# Patient Record
Sex: Male | Born: 1940 | Race: White | Hispanic: No | Smoking: Former smoker
Health system: Southern US, Community
[De-identification: ages and names within clinical notes are randomized; demographics above are authoritative.]

## PROBLEM LIST (undated history)

## (undated) DIAGNOSIS — F32A Depression, unspecified: Secondary | ICD-10-CM

## (undated) DIAGNOSIS — R52 Pain, unspecified: Secondary | ICD-10-CM

## (undated) DIAGNOSIS — Z87442 Personal history of urinary calculi: Secondary | ICD-10-CM

## (undated) DIAGNOSIS — R200 Anesthesia of skin: Secondary | ICD-10-CM

## (undated) DIAGNOSIS — M5410 Radiculopathy, site unspecified: Secondary | ICD-10-CM

## (undated) DIAGNOSIS — N400 Enlarged prostate without lower urinary tract symptoms: Secondary | ICD-10-CM

## (undated) DIAGNOSIS — M1990 Unspecified osteoarthritis, unspecified site: Secondary | ICD-10-CM

## (undated) DIAGNOSIS — M79662 Pain in left lower leg: Secondary | ICD-10-CM

## (undated) DIAGNOSIS — M549 Dorsalgia, unspecified: Secondary | ICD-10-CM

## (undated) DIAGNOSIS — I10 Essential (primary) hypertension: Secondary | ICD-10-CM

## (undated) DIAGNOSIS — K219 Gastro-esophageal reflux disease without esophagitis: Secondary | ICD-10-CM

## (undated) HISTORY — DX: Pain in left lower leg: M79.662

## (undated) HISTORY — PX: APPENDECTOMY: SHX54

## (undated) HISTORY — PX: TONSILLECTOMY: SUR1361

## (undated) HISTORY — DX: Radiculopathy, site unspecified: M54.10

## (undated) HISTORY — DX: Anesthesia of skin: R20.0

## (undated) HISTORY — DX: Dorsalgia, unspecified: M54.9

---

## 1976-05-27 HISTORY — PX: NECK SURGERY: SHX720

## 2000-10-24 ENCOUNTER — Emergency Department (HOSPITAL_COMMUNITY): Admission: EM | Admit: 2000-10-24 | Discharge: 2000-10-24 | Payer: Self-pay | Admitting: *Deleted

## 2000-10-30 ENCOUNTER — Ambulatory Visit (HOSPITAL_COMMUNITY): Admission: RE | Admit: 2000-10-30 | Discharge: 2000-10-30 | Payer: Self-pay | Admitting: Urology

## 2000-10-30 ENCOUNTER — Encounter: Payer: Self-pay | Admitting: Urology

## 2001-03-13 ENCOUNTER — Ambulatory Visit (HOSPITAL_COMMUNITY): Admission: RE | Admit: 2001-03-13 | Discharge: 2001-03-13 | Payer: Self-pay | Admitting: Gastroenterology

## 2001-12-23 ENCOUNTER — Encounter: Payer: Self-pay | Admitting: Urology

## 2001-12-23 ENCOUNTER — Encounter: Admission: RE | Admit: 2001-12-23 | Discharge: 2001-12-23 | Payer: Self-pay | Admitting: Urology

## 2009-07-20 DIAGNOSIS — N200 Calculus of kidney: Secondary | ICD-10-CM | POA: Insufficient documentation

## 2009-07-20 DIAGNOSIS — I10 Essential (primary) hypertension: Secondary | ICD-10-CM | POA: Insufficient documentation

## 2009-07-20 DIAGNOSIS — M1990 Unspecified osteoarthritis, unspecified site: Secondary | ICD-10-CM | POA: Insufficient documentation

## 2009-07-20 DIAGNOSIS — K219 Gastro-esophageal reflux disease without esophagitis: Secondary | ICD-10-CM | POA: Insufficient documentation

## 2011-05-28 HISTORY — PX: COLONOSCOPY W/ BIOPSIES AND POLYPECTOMY: SHX1376

## 2011-06-25 DIAGNOSIS — I10 Essential (primary) hypertension: Secondary | ICD-10-CM | POA: Diagnosis not present

## 2011-06-25 DIAGNOSIS — Z125 Encounter for screening for malignant neoplasm of prostate: Secondary | ICD-10-CM | POA: Diagnosis not present

## 2011-07-01 DIAGNOSIS — Z1212 Encounter for screening for malignant neoplasm of rectum: Secondary | ICD-10-CM | POA: Diagnosis not present

## 2011-07-02 DIAGNOSIS — N401 Enlarged prostate with lower urinary tract symptoms: Secondary | ICD-10-CM | POA: Diagnosis not present

## 2011-07-02 DIAGNOSIS — K219 Gastro-esophageal reflux disease without esophagitis: Secondary | ICD-10-CM | POA: Diagnosis not present

## 2011-07-02 DIAGNOSIS — L819 Disorder of pigmentation, unspecified: Secondary | ICD-10-CM | POA: Diagnosis not present

## 2011-07-02 DIAGNOSIS — Z0000 Encounter for general adult medical examination without abnormal findings: Secondary | ICD-10-CM | POA: Diagnosis not present

## 2011-07-02 DIAGNOSIS — D485 Neoplasm of uncertain behavior of skin: Secondary | ICD-10-CM | POA: Diagnosis not present

## 2011-07-02 DIAGNOSIS — D239 Other benign neoplasm of skin, unspecified: Secondary | ICD-10-CM | POA: Diagnosis not present

## 2011-07-02 DIAGNOSIS — L821 Other seborrheic keratosis: Secondary | ICD-10-CM | POA: Diagnosis not present

## 2011-07-02 DIAGNOSIS — I10 Essential (primary) hypertension: Secondary | ICD-10-CM | POA: Diagnosis not present

## 2011-07-03 DIAGNOSIS — N401 Enlarged prostate with lower urinary tract symptoms: Secondary | ICD-10-CM | POA: Diagnosis not present

## 2011-10-26 DIAGNOSIS — S60459A Superficial foreign body of unspecified finger, initial encounter: Secondary | ICD-10-CM | POA: Diagnosis not present

## 2011-10-26 DIAGNOSIS — Z23 Encounter for immunization: Secondary | ICD-10-CM | POA: Diagnosis not present

## 2012-02-11 DIAGNOSIS — Z23 Encounter for immunization: Secondary | ICD-10-CM | POA: Diagnosis not present

## 2012-03-16 DIAGNOSIS — H52209 Unspecified astigmatism, unspecified eye: Secondary | ICD-10-CM | POA: Diagnosis not present

## 2012-03-16 DIAGNOSIS — Z961 Presence of intraocular lens: Secondary | ICD-10-CM | POA: Diagnosis not present

## 2012-04-07 DIAGNOSIS — I7090 Unspecified atherosclerosis: Secondary | ICD-10-CM | POA: Diagnosis not present

## 2012-04-07 DIAGNOSIS — M1990 Unspecified osteoarthritis, unspecified site: Secondary | ICD-10-CM | POA: Diagnosis not present

## 2012-04-07 DIAGNOSIS — M47817 Spondylosis without myelopathy or radiculopathy, lumbosacral region: Secondary | ICD-10-CM | POA: Diagnosis not present

## 2012-04-07 DIAGNOSIS — M5137 Other intervertebral disc degeneration, lumbosacral region: Secondary | ICD-10-CM | POA: Diagnosis not present

## 2012-04-07 DIAGNOSIS — IMO0002 Reserved for concepts with insufficient information to code with codable children: Secondary | ICD-10-CM | POA: Diagnosis not present

## 2012-04-07 DIAGNOSIS — R030 Elevated blood-pressure reading, without diagnosis of hypertension: Secondary | ICD-10-CM | POA: Diagnosis not present

## 2012-04-09 DIAGNOSIS — M47817 Spondylosis without myelopathy or radiculopathy, lumbosacral region: Secondary | ICD-10-CM | POA: Insufficient documentation

## 2012-05-15 DIAGNOSIS — M545 Low back pain, unspecified: Secondary | ICD-10-CM | POA: Diagnosis not present

## 2012-05-15 DIAGNOSIS — M546 Pain in thoracic spine: Secondary | ICD-10-CM | POA: Diagnosis not present

## 2012-05-15 DIAGNOSIS — M48061 Spinal stenosis, lumbar region without neurogenic claudication: Secondary | ICD-10-CM | POA: Diagnosis not present

## 2012-05-15 DIAGNOSIS — M5137 Other intervertebral disc degeneration, lumbosacral region: Secondary | ICD-10-CM | POA: Diagnosis not present

## 2012-05-15 DIAGNOSIS — IMO0002 Reserved for concepts with insufficient information to code with codable children: Secondary | ICD-10-CM | POA: Diagnosis not present

## 2012-05-15 DIAGNOSIS — M47817 Spondylosis without myelopathy or radiculopathy, lumbosacral region: Secondary | ICD-10-CM | POA: Diagnosis not present

## 2012-06-26 DIAGNOSIS — I10 Essential (primary) hypertension: Secondary | ICD-10-CM | POA: Diagnosis not present

## 2012-06-26 DIAGNOSIS — Z125 Encounter for screening for malignant neoplasm of prostate: Secondary | ICD-10-CM | POA: Diagnosis not present

## 2012-06-30 DIAGNOSIS — M5137 Other intervertebral disc degeneration, lumbosacral region: Secondary | ICD-10-CM | POA: Diagnosis not present

## 2012-07-03 DIAGNOSIS — IMO0002 Reserved for concepts with insufficient information to code with codable children: Secondary | ICD-10-CM | POA: Diagnosis not present

## 2012-07-03 DIAGNOSIS — Z1331 Encounter for screening for depression: Secondary | ICD-10-CM | POA: Diagnosis not present

## 2012-07-03 DIAGNOSIS — M47817 Spondylosis without myelopathy or radiculopathy, lumbosacral region: Secondary | ICD-10-CM | POA: Diagnosis not present

## 2012-07-03 DIAGNOSIS — Z0000 Encounter for general adult medical examination without abnormal findings: Secondary | ICD-10-CM | POA: Diagnosis not present

## 2012-07-08 DIAGNOSIS — R351 Nocturia: Secondary | ICD-10-CM | POA: Diagnosis not present

## 2012-07-08 DIAGNOSIS — N401 Enlarged prostate with lower urinary tract symptoms: Secondary | ICD-10-CM | POA: Diagnosis not present

## 2012-10-06 DIAGNOSIS — M545 Low back pain, unspecified: Secondary | ICD-10-CM | POA: Diagnosis not present

## 2012-10-06 DIAGNOSIS — IMO0002 Reserved for concepts with insufficient information to code with codable children: Secondary | ICD-10-CM | POA: Diagnosis not present

## 2012-10-06 DIAGNOSIS — M5137 Other intervertebral disc degeneration, lumbosacral region: Secondary | ICD-10-CM | POA: Diagnosis not present

## 2012-10-06 DIAGNOSIS — M47817 Spondylosis without myelopathy or radiculopathy, lumbosacral region: Secondary | ICD-10-CM | POA: Diagnosis not present

## 2012-10-15 DIAGNOSIS — M47817 Spondylosis without myelopathy or radiculopathy, lumbosacral region: Secondary | ICD-10-CM | POA: Diagnosis not present

## 2012-11-10 DIAGNOSIS — M5137 Other intervertebral disc degeneration, lumbosacral region: Secondary | ICD-10-CM | POA: Diagnosis not present

## 2012-11-10 DIAGNOSIS — IMO0002 Reserved for concepts with insufficient information to code with codable children: Secondary | ICD-10-CM | POA: Diagnosis not present

## 2012-11-10 DIAGNOSIS — M47817 Spondylosis without myelopathy or radiculopathy, lumbosacral region: Secondary | ICD-10-CM | POA: Diagnosis not present

## 2012-11-10 DIAGNOSIS — M48061 Spinal stenosis, lumbar region without neurogenic claudication: Secondary | ICD-10-CM | POA: Diagnosis not present

## 2012-12-10 DIAGNOSIS — M48061 Spinal stenosis, lumbar region without neurogenic claudication: Secondary | ICD-10-CM | POA: Diagnosis not present

## 2012-12-10 DIAGNOSIS — M5137 Other intervertebral disc degeneration, lumbosacral region: Secondary | ICD-10-CM | POA: Diagnosis not present

## 2012-12-10 DIAGNOSIS — IMO0002 Reserved for concepts with insufficient information to code with codable children: Secondary | ICD-10-CM | POA: Diagnosis not present

## 2012-12-10 DIAGNOSIS — M47817 Spondylosis without myelopathy or radiculopathy, lumbosacral region: Secondary | ICD-10-CM | POA: Diagnosis not present

## 2013-01-08 DIAGNOSIS — M48061 Spinal stenosis, lumbar region without neurogenic claudication: Secondary | ICD-10-CM | POA: Diagnosis not present

## 2013-01-08 DIAGNOSIS — M545 Low back pain, unspecified: Secondary | ICD-10-CM | POA: Diagnosis not present

## 2013-01-08 DIAGNOSIS — M47817 Spondylosis without myelopathy or radiculopathy, lumbosacral region: Secondary | ICD-10-CM | POA: Diagnosis not present

## 2013-01-08 DIAGNOSIS — IMO0002 Reserved for concepts with insufficient information to code with codable children: Secondary | ICD-10-CM | POA: Diagnosis not present

## 2013-02-03 DIAGNOSIS — N200 Calculus of kidney: Secondary | ICD-10-CM | POA: Diagnosis not present

## 2013-02-03 DIAGNOSIS — N390 Urinary tract infection, site not specified: Secondary | ICD-10-CM | POA: Diagnosis not present

## 2013-02-03 DIAGNOSIS — N201 Calculus of ureter: Secondary | ICD-10-CM | POA: Diagnosis not present

## 2013-02-11 DIAGNOSIS — Z23 Encounter for immunization: Secondary | ICD-10-CM | POA: Diagnosis not present

## 2013-03-09 ENCOUNTER — Other Ambulatory Visit: Payer: Self-pay | Admitting: Neurosurgery

## 2013-03-09 DIAGNOSIS — IMO0002 Reserved for concepts with insufficient information to code with codable children: Secondary | ICD-10-CM | POA: Diagnosis not present

## 2013-03-09 DIAGNOSIS — M47817 Spondylosis without myelopathy or radiculopathy, lumbosacral region: Secondary | ICD-10-CM | POA: Diagnosis not present

## 2013-03-09 DIAGNOSIS — M545 Low back pain, unspecified: Secondary | ICD-10-CM | POA: Diagnosis not present

## 2013-03-09 DIAGNOSIS — M48061 Spinal stenosis, lumbar region without neurogenic claudication: Secondary | ICD-10-CM | POA: Diagnosis not present

## 2013-03-10 DIAGNOSIS — D1801 Hemangioma of skin and subcutaneous tissue: Secondary | ICD-10-CM | POA: Diagnosis not present

## 2013-03-10 DIAGNOSIS — D239 Other benign neoplasm of skin, unspecified: Secondary | ICD-10-CM | POA: Diagnosis not present

## 2013-03-10 DIAGNOSIS — L821 Other seborrheic keratosis: Secondary | ICD-10-CM | POA: Diagnosis not present

## 2013-03-16 ENCOUNTER — Encounter (HOSPITAL_COMMUNITY): Payer: Self-pay | Admitting: Pharmacy Technician

## 2013-03-18 ENCOUNTER — Encounter (HOSPITAL_COMMUNITY): Payer: Self-pay

## 2013-03-18 ENCOUNTER — Encounter (HOSPITAL_COMMUNITY)
Admission: RE | Admit: 2013-03-18 | Discharge: 2013-03-18 | Disposition: A | Discharge status: Home / Self Care | Payer: Medicare Other | Source: Ambulatory Visit | Attending: Neurosurgery | Admitting: Neurosurgery

## 2013-03-18 ENCOUNTER — Other Ambulatory Visit (HOSPITAL_COMMUNITY): Payer: Self-pay | Admitting: *Deleted

## 2013-03-18 DIAGNOSIS — I10 Essential (primary) hypertension: Secondary | ICD-10-CM | POA: Diagnosis not present

## 2013-03-18 DIAGNOSIS — Z01810 Encounter for preprocedural cardiovascular examination: Secondary | ICD-10-CM | POA: Insufficient documentation

## 2013-03-18 DIAGNOSIS — Z01818 Encounter for other preprocedural examination: Secondary | ICD-10-CM | POA: Diagnosis not present

## 2013-03-18 DIAGNOSIS — Z01812 Encounter for preprocedural laboratory examination: Secondary | ICD-10-CM | POA: Insufficient documentation

## 2013-03-18 HISTORY — DX: Gastro-esophageal reflux disease without esophagitis: K21.9

## 2013-03-18 HISTORY — DX: Essential (primary) hypertension: I10

## 2013-03-18 LAB — BASIC METABOLIC PANEL WITH GFR
BUN: 19 mg/dL (ref 6–23)
CO2: 25 meq/L (ref 19–32)
Calcium: 9.1 mg/dL (ref 8.4–10.5)
Chloride: 101 meq/L (ref 96–112)
Creatinine, Ser: 0.91 mg/dL (ref 0.50–1.35)
GFR calc Af Amer: >90 mL/min
GFR calc non Af Amer: 84 mL/min — ABNORMAL LOW
Glucose, Bld: 100 mg/dL — ABNORMAL HIGH (ref 70–99)
Potassium: 3.8 meq/L (ref 3.5–5.1)
Sodium: 137 meq/L (ref 135–145)

## 2013-03-18 LAB — CBC
HCT: 43.4 % (ref 39.0–52.0)
Hemoglobin: 15.1 g/dL (ref 13.0–17.0)
MCH: 32.9 pg (ref 26.0–34.0)
MCHC: 34.8 g/dL (ref 30.0–36.0)
MCV: 94.6 fL (ref 78.0–100.0)
Platelets: 233 10*3/uL (ref 150–400)
RBC: 4.59 MIL/uL (ref 4.22–5.81)
RDW: 12.6 % (ref 11.5–15.5)
WBC: 5.7 10*3/uL (ref 4.0–10.5)

## 2013-03-18 LAB — SURGICAL PCR SCREEN
MRSA, PCR: NEGATIVE
Staphylococcus aureus: NEGATIVE

## 2013-03-18 NOTE — Pre-Procedure Instructions (Signed)
Brent Hanna  03/18/2013   Your procedure is scheduled on:  Wednesday, March 24, 2013 at 9:00 AM.   Report to [facility] Entrance "A" at 7:00 AM.   Call this number if you have problems the morning of surgery: [phone]   Remember:   Do not eat food or drink liquids after midnight Tuesday, 03/23/13.   Take these medicines the morning of surgery with A SIP OF WATER: silodosin (RAPAFLO)   Stop all Vitamins, Herbal Medications, Aspirin, and NSAIDS (Naproxen, Ibuprofen) as of today, 03/18/13    Do not wear jewelry.  Do not wear lotions, powders, or cologne. You may wear deodorant.  Do not shave 48 hours prior to surgery. Men may shave face and neck.  Do not bring valuables to the hospital.  [facility] is not responsible                  for any belongings or valuables.               Contacts, dentures or bridgework may not be worn into surgery.  Leave suitcase in the car. After surgery it may be brought to your room.  For patients admitted to the hospital, discharge time is determined by your                treatment team.              Special Instructions: Shower using CHG 2 nights before surgery and the night before surgery.  If you shower the day of surgery use CHG.  Use special wash - you have one bottle of CHG for all showers.  You should use approximately 1/3 of the bottle for each shower.   Please read over the following fact sheets that you were given: Pain Booklet, Coughing and Deep Breathing, MRSA Information and Surgical Site Infection Prevention

## 2013-03-23 MED ORDER — CEFAZOLIN SODIUM-DEXTROSE 2-3 GM-% IV SOLR
2.0000 g | INTRAVENOUS | Status: AC
  Administered 2013-03-24: 2 g via INTRAVENOUS
  Filled 2013-03-23: qty 50

## 2013-03-24 ENCOUNTER — Encounter (HOSPITAL_COMMUNITY): Payer: Self-pay | Admitting: Surgery

## 2013-03-24 ENCOUNTER — Inpatient Hospital Stay (HOSPITAL_COMMUNITY): Payer: Medicare Other

## 2013-03-24 ENCOUNTER — Encounter (HOSPITAL_COMMUNITY): Payer: Medicare Other | Admitting: Certified Registered Nurse Anesthetist

## 2013-03-24 ENCOUNTER — Inpatient Hospital Stay (HOSPITAL_COMMUNITY): Payer: Medicare Other | Admitting: Certified Registered Nurse Anesthetist

## 2013-03-24 ENCOUNTER — Inpatient Hospital Stay (HOSPITAL_COMMUNITY)
Admission: RE | Admit: 2013-03-24 | Discharge: 2013-03-25 | DRG: 460 | Disposition: A | Discharge status: Home / Self Care | Payer: Medicare Other | Source: Ambulatory Visit | Attending: Neurosurgery | Admitting: Neurosurgery

## 2013-03-24 ENCOUNTER — Encounter (HOSPITAL_COMMUNITY)
Admission: RE | Disposition: A | Discharge status: Home / Self Care | Payer: Medicare Other | Source: Ambulatory Visit | Attending: Neurosurgery

## 2013-03-24 DIAGNOSIS — K219 Gastro-esophageal reflux disease without esophagitis: Secondary | ICD-10-CM | POA: Diagnosis present

## 2013-03-24 DIAGNOSIS — I129 Hypertensive chronic kidney disease with stage 1 through stage 4 chronic kidney disease, or unspecified chronic kidney disease: Secondary | ICD-10-CM | POA: Diagnosis not present

## 2013-03-24 DIAGNOSIS — M5137 Other intervertebral disc degeneration, lumbosacral region: Secondary | ICD-10-CM | POA: Diagnosis present

## 2013-03-24 DIAGNOSIS — M51379 Other intervertebral disc degeneration, lumbosacral region without mention of lumbar back pain or lower extremity pain: Secondary | ICD-10-CM | POA: Diagnosis present

## 2013-03-24 DIAGNOSIS — M47817 Spondylosis without myelopathy or radiculopathy, lumbosacral region: Principal | ICD-10-CM | POA: Diagnosis present

## 2013-03-24 DIAGNOSIS — M48061 Spinal stenosis, lumbar region without neurogenic claudication: Secondary | ICD-10-CM | POA: Diagnosis not present

## 2013-03-24 DIAGNOSIS — N189 Chronic kidney disease, unspecified: Secondary | ICD-10-CM | POA: Diagnosis present

## 2013-03-24 DIAGNOSIS — M519 Unspecified thoracic, thoracolumbar and lumbosacral intervertebral disc disorder: Secondary | ICD-10-CM | POA: Diagnosis not present

## 2013-03-24 DIAGNOSIS — IMO0002 Reserved for concepts with insufficient information to code with codable children: Secondary | ICD-10-CM | POA: Diagnosis not present

## 2013-03-24 HISTORY — PX: LAMINECTOMY WITH POSTERIOR LATERAL ARTHRODESIS LEVEL 1: SHX6335

## 2013-03-24 SURGERY — LAMINECTOMY WITH POSTERIOR LATERAL ARTHRODESIS LEVEL 1
Anesthesia: General | Site: Spine Lumbar | Laterality: Left | Wound class: Clean

## 2013-03-24 MED ORDER — LOSARTAN POTASSIUM 25 MG PO TABS
12.5000 mg | ORAL_TABLET | ORAL | Status: DC
  Administered 2013-03-24: 12.5 mg via ORAL
  Filled 2013-03-24: qty 0.5

## 2013-03-24 MED ORDER — HYDROMORPHONE HCL PF 1 MG/ML IJ SOLN
INTRAMUSCULAR | Status: AC
  Filled 2013-03-24: qty 1

## 2013-03-24 MED ORDER — ARTIFICIAL TEARS OP OINT
TOPICAL_OINTMENT | OPHTHALMIC | Status: DC | PRN
  Administered 2013-03-24: 1 via OPHTHALMIC

## 2013-03-24 MED ORDER — GLYCOPYRROLATE 0.2 MG/ML IJ SOLN
INTRAMUSCULAR | Status: DC | PRN
  Administered 2013-03-24: .8 mg via INTRAVENOUS

## 2013-03-24 MED ORDER — KCL IN DEXTROSE-NACL 20-5-0.45 MEQ/L-%-% IV SOLN
INTRAVENOUS | Status: DC
  Filled 2013-03-24 (×5): qty 1000

## 2013-03-24 MED ORDER — ROCURONIUM BROMIDE 100 MG/10ML IV SOLN
INTRAVENOUS | Status: DC | PRN
  Administered 2013-03-24: 50 mg via INTRAVENOUS

## 2013-03-24 MED ORDER — MIDAZOLAM HCL 5 MG/5ML IJ SOLN
INTRAMUSCULAR | Status: DC | PRN
  Administered 2013-03-24 (×2): 1 mg via INTRAVENOUS

## 2013-03-24 MED ORDER — KETOROLAC TROMETHAMINE 30 MG/ML IJ SOLN
30.0000 mg | Freq: Once | INTRAMUSCULAR | Status: AC
  Administered 2013-03-24: 30 mg via INTRAVENOUS

## 2013-03-24 MED ORDER — ACETAMINOPHEN 650 MG RE SUPP: 650.0000 mg | RECTAL | Status: DC | PRN

## 2013-03-24 MED ORDER — OXYCODONE-ACETAMINOPHEN 5-325 MG PO TABS
ORAL_TABLET | ORAL | Status: AC
  Filled 2013-03-24: qty 2

## 2013-03-24 MED ORDER — HYDROXYZINE HCL 25 MG PO TABS: 50.0000 mg | ORAL_TABLET | ORAL | Status: DC | PRN

## 2013-03-24 MED ORDER — ALBUMIN HUMAN 5 % IV SOLN
INTRAVENOUS | Status: DC | PRN
  Administered 2013-03-24: 11:00:00 via INTRAVENOUS

## 2013-03-24 MED ORDER — KETOROLAC TROMETHAMINE 30 MG/ML IJ SOLN
INTRAMUSCULAR | Status: AC
  Filled 2013-03-24: qty 1

## 2013-03-24 MED ORDER — PHENYLEPHRINE HCL 10 MG/ML IJ SOLN
INTRAMUSCULAR | Status: DC | PRN
  Administered 2013-03-24: 80 ug via INTRAVENOUS
  Administered 2013-03-24 (×2): 40 ug via INTRAVENOUS

## 2013-03-24 MED ORDER — ZOLPIDEM TARTRATE 5 MG PO TABS: 5.0000 mg | ORAL_TABLET | Freq: Every evening | ORAL | Status: DC | PRN

## 2013-03-24 MED ORDER — ONDANSETRON HCL 4 MG/2ML IJ SOLN: 4.0000 mg | Freq: Four times a day (QID) | INTRAMUSCULAR | Status: DC | PRN

## 2013-03-24 MED ORDER — FENTANYL CITRATE 0.05 MG/ML IJ SOLN
INTRAMUSCULAR | Status: DC | PRN
  Administered 2013-03-24 (×2): 50 ug via INTRAVENOUS
  Administered 2013-03-24: 100 ug via INTRAVENOUS

## 2013-03-24 MED ORDER — PROPOFOL 10 MG/ML IV BOLUS
INTRAVENOUS | Status: DC | PRN
  Administered 2013-03-24: 60 mg via INTRAVENOUS
  Administered 2013-03-24: 140 mg via INTRAVENOUS

## 2013-03-24 MED ORDER — VECURONIUM BROMIDE 10 MG IV SOLR
INTRAVENOUS | Status: DC | PRN
  Administered 2013-03-24 (×2): 1 mg via INTRAVENOUS
  Administered 2013-03-24: 2 mg via INTRAVENOUS
  Administered 2013-03-24: 1 mg via INTRAVENOUS
  Administered 2013-03-24: 2 mg via INTRAVENOUS

## 2013-03-24 MED ORDER — CYCLOBENZAPRINE HCL 10 MG PO TABS
10.0000 mg | ORAL_TABLET | Freq: Three times a day (TID) | ORAL | Status: DC | PRN
  Administered 2013-03-24 (×2): 10 mg via ORAL
  Filled 2013-03-24: qty 1

## 2013-03-24 MED ORDER — NEOSTIGMINE METHYLSULFATE 1 MG/ML IJ SOLN
INTRAMUSCULAR | Status: DC | PRN
  Administered 2013-03-24: 5 mg via INTRAVENOUS

## 2013-03-24 MED ORDER — KETOROLAC TROMETHAMINE 30 MG/ML IJ SOLN
30.0000 mg | Freq: Four times a day (QID) | INTRAMUSCULAR | Status: DC
  Administered 2013-03-24 – 2013-03-25 (×4): 30 mg via INTRAVENOUS
  Filled 2013-03-24 (×7): qty 1

## 2013-03-24 MED ORDER — ONDANSETRON HCL 4 MG/2ML IJ SOLN
INTRAMUSCULAR | Status: DC | PRN
  Administered 2013-03-24: 4 mg via INTRAVENOUS

## 2013-03-24 MED ORDER — CYCLOBENZAPRINE HCL 10 MG PO TABS
ORAL_TABLET | ORAL | Status: AC
  Filled 2013-03-24: qty 1

## 2013-03-24 MED ORDER — MORPHINE SULFATE 4 MG/ML IJ SOLN: 4.0000 mg | INTRAMUSCULAR | Status: DC | PRN

## 2013-03-24 MED ORDER — ALUM & MAG HYDROXIDE-SIMETH 200-200-20 MG/5ML PO SUSP: 30.0000 mL | Freq: Four times a day (QID) | ORAL | Status: DC | PRN

## 2013-03-24 MED ORDER — SODIUM CHLORIDE 0.9 % IJ SOLN: 3.0000 mL | INTRAMUSCULAR | Status: DC | PRN

## 2013-03-24 MED ORDER — TAMSULOSIN HCL 0.4 MG PO CAPS
0.4000 mg | ORAL_CAPSULE | Freq: Every day | ORAL | Status: DC
  Administered 2013-03-25: 0.4 mg via ORAL
  Filled 2013-03-24 (×3): qty 1

## 2013-03-24 MED ORDER — LIDOCAINE HCL (CARDIAC) 20 MG/ML IV SOLN
INTRAVENOUS | Status: DC | PRN
  Administered 2013-03-24: 50 mg via INTRAVENOUS

## 2013-03-24 MED ORDER — DEXAMETHASONE SODIUM PHOSPHATE 4 MG/ML IJ SOLN
INTRAMUSCULAR | Status: DC | PRN
  Administered 2013-03-24: 8 mg via INTRAVENOUS

## 2013-03-24 MED ORDER — THROMBIN 20000 UNITS EX SOLR
CUTANEOUS | Status: DC | PRN
  Administered 2013-03-24: 10:00:00 via TOPICAL

## 2013-03-24 MED ORDER — HYDROMORPHONE HCL PF 1 MG/ML IJ SOLN
0.2500 mg | INTRAMUSCULAR | Status: DC | PRN
  Administered 2013-03-24 (×4): 0.5 mg via INTRAVENOUS

## 2013-03-24 MED ORDER — SODIUM CHLORIDE 0.9 % IR SOLN
Status: DC | PRN
  Administered 2013-03-24: 10:00:00

## 2013-03-24 MED ORDER — 0.9 % SODIUM CHLORIDE (POUR BTL) OPTIME
TOPICAL | Status: DC | PRN
  Administered 2013-03-24: 1000 mL

## 2013-03-24 MED ORDER — BISACODYL 10 MG RE SUPP: 10.0000 mg | Freq: Every day | RECTAL | Status: DC | PRN

## 2013-03-24 MED ORDER — LACTATED RINGERS IV SOLN
INTRAVENOUS | Status: DC
  Administered 2013-03-24 (×2): via INTRAVENOUS

## 2013-03-24 MED ORDER — LIDOCAINE-EPINEPHRINE 1 %-1:100000 IJ SOLN
INTRAMUSCULAR | Status: DC | PRN
  Administered 2013-03-24: 15 mL

## 2013-03-24 MED ORDER — ACETAMINOPHEN 325 MG PO TABS: 650.0000 mg | ORAL_TABLET | ORAL | Status: DC | PRN

## 2013-03-24 MED ORDER — ACETAMINOPHEN 10 MG/ML IV SOLN
INTRAVENOUS | Status: AC
  Administered 2013-03-24: 1000 mg via INTRAVENOUS
  Filled 2013-03-24: qty 100

## 2013-03-24 MED ORDER — HYDROCODONE-ACETAMINOPHEN 5-325 MG PO TABS: 1.0000 | ORAL_TABLET | ORAL | Status: DC | PRN

## 2013-03-24 MED ORDER — MENTHOL 3 MG MT LOZG: 1.0000 | LOZENGE | OROMUCOSAL | Status: DC | PRN

## 2013-03-24 MED ORDER — SODIUM CHLORIDE 0.9 % IJ SOLN
3.0000 mL | Freq: Two times a day (BID) | INTRAMUSCULAR | Status: DC
  Administered 2013-03-24 – 2013-03-25 (×2): 3 mL via INTRAVENOUS

## 2013-03-24 MED ORDER — PHENOL 1.4 % MT LIQD: 1.0000 | OROMUCOSAL | Status: DC | PRN

## 2013-03-24 MED ORDER — OXYCODONE-ACETAMINOPHEN 5-325 MG PO TABS
1.0000 | ORAL_TABLET | ORAL | Status: DC | PRN
  Administered 2013-03-24 – 2013-03-25 (×4): 2 via ORAL
  Filled 2013-03-24 (×3): qty 2

## 2013-03-24 MED ORDER — MAGNESIUM HYDROXIDE 400 MG/5ML PO SUSP: 30.0000 mL | Freq: Every day | ORAL | Status: DC | PRN

## 2013-03-24 MED ORDER — PHENYLEPHRINE HCL 10 MG/ML IJ SOLN
10.0000 mg | INTRAVENOUS | Status: DC | PRN
  Administered 2013-03-24: 10 ug/min via INTRAVENOUS

## 2013-03-24 MED ORDER — CEFAZOLIN SODIUM 1-5 GM-% IV SOLN
INTRAVENOUS | Status: AC
  Filled 2013-03-24: qty 100

## 2013-03-24 MED ORDER — BUPIVACAINE HCL (PF) 0.5 % IJ SOLN
INTRAMUSCULAR | Status: DC | PRN
  Administered 2013-03-24: 15 mL

## 2013-03-24 SURGICAL SUPPLY — 70 items
BAG DECANTER FOR FLEXI CONT (MISCELLANEOUS) ×2 IMPLANT
BENZOIN TINCTURE PRP APPL 2/3 (GAUZE/BANDAGES/DRESSINGS) IMPLANT
BLADE SURG ROTATE 9660 (MISCELLANEOUS) IMPLANT
BRUSH SCRUB EZ PLAIN DRY (MISCELLANEOUS) ×2 IMPLANT
BUR ACORN 6.0 ACORN (BURR) IMPLANT
BUR ACRON 5.0MM COATED (BURR) ×2 IMPLANT
BUR MATCHSTICK NEURO 3.0 LAGG (BURR) ×2 IMPLANT
CANISTER SUCT 3000ML (MISCELLANEOUS) ×2 IMPLANT
CAP LCK SPNE (Orthopedic Implant) ×4 IMPLANT
CAP LOCK SPINE RADIUS (Orthopedic Implant) ×4 IMPLANT
CAP LOCKING (Orthopedic Implant) ×4 IMPLANT
CONT SPEC 4OZ CLIKSEAL STRL BL (MISCELLANEOUS) ×12 IMPLANT
DERMABOND ADHESIVE PROPEN (GAUZE/BANDAGES/DRESSINGS) ×2
DERMABOND ADVANCED (GAUZE/BANDAGES/DRESSINGS)
DERMABOND ADVANCED .7 DNX12 (GAUZE/BANDAGES/DRESSINGS) IMPLANT
DERMABOND ADVANCED .7 DNX6 (GAUZE/BANDAGES/DRESSINGS) ×2 IMPLANT
DRAPE C-ARM 42X72 X-RAY (DRAPES) ×10 IMPLANT
DRAPE LAPAROTOMY 100X72X124 (DRAPES) ×2 IMPLANT
DRAPE MICROSCOPE LEICA (MISCELLANEOUS) ×2 IMPLANT
DRAPE POUCH INSTRU U-SHP 10X18 (DRAPES) ×2 IMPLANT
DRSG EMULSION OIL 3X3 NADH (GAUZE/BANDAGES/DRESSINGS) IMPLANT
ELECT REM PT RETURN 9FT ADLT (ELECTROSURGICAL) ×2
ELECTRODE REM PT RTRN 9FT ADLT (ELECTROSURGICAL) ×1 IMPLANT
GAUZE SPONGE 4X4 16PLY XRAY LF (GAUZE/BANDAGES/DRESSINGS) ×2 IMPLANT
GLOVE BIO SURGEON STRL SZ8 (GLOVE) ×2 IMPLANT
GLOVE BIOGEL PI IND STRL 7.0 (GLOVE) ×2 IMPLANT
GLOVE BIOGEL PI IND STRL 8 (GLOVE) ×4 IMPLANT
GLOVE BIOGEL PI INDICATOR 7.0 (GLOVE) ×2
GLOVE BIOGEL PI INDICATOR 8 (GLOVE) ×4
GLOVE ECLIPSE 7.5 STRL STRAW (GLOVE) ×10 IMPLANT
GLOVE EXAM NITRILE LRG STRL (GLOVE) IMPLANT
GLOVE EXAM NITRILE MD LF STRL (GLOVE) IMPLANT
GLOVE EXAM NITRILE XL STR (GLOVE) IMPLANT
GLOVE EXAM NITRILE XS STR PU (GLOVE) IMPLANT
GLOVE INDICATOR 8.5 STRL (GLOVE) ×2 IMPLANT
GOWN BRE IMP SLV AUR LG STRL (GOWN DISPOSABLE) ×2 IMPLANT
GOWN BRE IMP SLV AUR XL STRL (GOWN DISPOSABLE) ×4 IMPLANT
GOWN STRL REIN 2XL LVL4 (GOWN DISPOSABLE) ×2 IMPLANT
KIT BASIN OR (CUSTOM PROCEDURE TRAY) ×2 IMPLANT
KIT INFUSE X SMALL 1.4CC (Orthopedic Implant) ×2 IMPLANT
KIT ROOM TURNOVER OR (KITS) ×2 IMPLANT
NEEDLE BONE MARROW 8GAX6 (NEEDLE) ×2 IMPLANT
NEEDLE HYPO 18GX1.5 BLUNT FILL (NEEDLE) IMPLANT
NEEDLE SPNL 18GX3.5 QUINCKE PK (NEEDLE) ×2 IMPLANT
NEEDLE SPNL 22GX3.5 QUINCKE BK (NEEDLE) ×2 IMPLANT
NS IRRIG 1000ML POUR BTL (IV SOLUTION) ×2 IMPLANT
PACK LAMINECTOMY NEURO (CUSTOM PROCEDURE TRAY) ×2 IMPLANT
PAD ARMBOARD 7.5X6 YLW CONV (MISCELLANEOUS) ×10 IMPLANT
PATTIES SURGICAL .5 X1 (DISPOSABLE) ×2 IMPLANT
ROD 5.5X30MM (Rod) ×2 IMPLANT
ROD RADIUS 35MM (Rod) ×2 IMPLANT
RUBBERBAND STERILE (MISCELLANEOUS) ×4 IMPLANT
SCREW 5.75 X 635 (Screw) ×8 IMPLANT
SCREW 5.75X40M (Screw) ×2 IMPLANT
SPONGE GAUZE 4X4 12PLY (GAUZE/BANDAGES/DRESSINGS) ×2 IMPLANT
SPONGE LAP 4X18 X RAY DECT (DISPOSABLE) IMPLANT
SPONGE SURGIFOAM ABS GEL SZ50 (HEMOSTASIS) ×2 IMPLANT
STRIP BIOACTIVE VITOSS 25X100X (Neuro Prosthesis/Implant) ×2 IMPLANT
STRIP CLOSURE SKIN 1/2X4 (GAUZE/BANDAGES/DRESSINGS) IMPLANT
SUT PROLENE 6 0 BV (SUTURE) IMPLANT
SUT VIC AB 1 CT1 18XBRD ANBCTR (SUTURE) ×2 IMPLANT
SUT VIC AB 1 CT1 8-18 (SUTURE) ×2
SUT VIC AB 2-0 CP2 18 (SUTURE) ×4 IMPLANT
SUT VIC AB 3-0 SH 8-18 (SUTURE) ×2 IMPLANT
SYR 20ML ECCENTRIC (SYRINGE) ×2 IMPLANT
SYR 5ML LL (SYRINGE) IMPLANT
SYR CONTROL 10ML LL (SYRINGE) ×2 IMPLANT
TOWEL OR 17X24 6PK STRL BLUE (TOWEL DISPOSABLE) ×2 IMPLANT
TOWEL OR 17X26 10 PK STRL BLUE (TOWEL DISPOSABLE) ×2 IMPLANT
WATER STERILE IRR 1000ML POUR (IV SOLUTION) ×2 IMPLANT

## 2013-03-24 NOTE — Anesthesia Preprocedure Evaluation (Addendum)
Anesthesia Evaluation  Patient identified by MRN, date of birth, ID band Patient awake    Reviewed: Allergy & Precautions, H&P , NPO status , Patient's Chart, lab work & pertinent test results  Airway Mallampati: II TM Distance: >3 FB Neck ROM: Full    Dental no notable dental hx. (+) Teeth Intact and Dental Advisory Given   Pulmonary neg pulmonary ROS,  breath sounds clear to auscultation  Pulmonary exam normal       Cardiovascular hypertension, On Medications Rhythm:Regular Rate:Normal     Neuro/Psych negative neurological ROS  negative psych ROS   GI/Hepatic Neg liver ROS, GERD-  Controlled,  Endo/Other  negative endocrine ROS  Renal/GU Renal disease  negative genitourinary   Musculoskeletal   Abdominal   Peds  Hematology negative hematology ROS (+)   Anesthesia Other Findings   Reproductive/Obstetrics negative OB ROS                          Anesthesia Physical Anesthesia Plan  ASA: II  Anesthesia Plan: General   Post-op Pain Management:    Induction: Intravenous  Airway Management Planned: Oral ETT  Additional Equipment:   Intra-op Plan:   Post-operative Plan: Extubation in OR  Informed Consent: I have reviewed the patients History and Physical, chart, labs and discussed the procedure including the risks, benefits and alternatives for the proposed anesthesia with the patient or authorized representative who has indicated his/her understanding and acceptance.   Dental advisory given  Plan Discussed with: CRNA  Anesthesia Plan Comments:         Anesthesia Quick Evaluation

## 2013-03-24 NOTE — Preoperative (Signed)
Beta Blockers   Reason not to administer Beta Blockers:Not Applicable 

## 2013-03-24 NOTE — Anesthesia Procedure Notes (Addendum)
Procedure Name: Intubation Date/Time: 03/24/2013 8:54 AM Performed by: De Nurse Pre-anesthesia Checklist: Patient identified, Emergency Drugs available, Suction available, Patient being monitored and Timeout performed Patient Re-evaluated:Patient Re-evaluated prior to inductionOxygen Delivery Method: Circle system utilized Preoxygenation: Pre-oxygenation with 100% oxygen Intubation Type: IV induction Ventilation: Mask ventilation without difficulty and Oral airway inserted - appropriate to patient size Laryngoscope Size: Mac and 3 Grade View: Grade III Tube type: Oral Tube size: 7.5 mm Number of attempts: 2 (SRNA x 1, CRNA x 1 and successful) Airway Equipment and Method: Stylet and Oral airway Placement Confirmation: ETT inserted through vocal cords under direct vision,  positive ETCO2,  CO2 detector and breath sounds checked- equal and bilateral Secured at: 23 cm Tube secured with: Tape Dental Injury: Teeth and Oropharynx as per pre-operative assessment  Difficulty Due To: Difficulty was unanticipated and Difficult Airway- due to anterior larynx Future Recommendations: Recommend- induction with short-acting agent, and alternative techniques readily available Comments: DL x 1 with mac 3 by SRNA, unable to obtain view due to anterior larynx. CRNA attempt provided grade 3 view with mac 3 blade. +anterior airway. 7.5 ETT passed carefully between cords. ETC02 confirmed and bilateral breath sounds equal.

## 2013-03-24 NOTE — Plan of Care (Signed)
Problem: Consults Goal: Diagnosis - Spinal Surgery Outcome: Completed/Met Date Met:  03/24/13 Thoraco/Lumbar Spine Fusion

## 2013-03-24 NOTE — Progress Notes (Signed)
Filed Vitals:   03/24/13 1415 03/24/13 1430 03/24/13 1445 03/24/13 1515  BP: 110/63 111/72 121/66 127/70  Pulse: 99 97 88 106  Temp:  96.8 F (36 C)  98.5 F (36.9 C)  TempSrc:      Resp: 13 14 19 18   Height:      Weight:      SpO2: 98% 98% 98% 93%    Patient resting in bed, recently arrived from PACU. Mildly drowsy still from anesthesia and pain medication. Following commands, moving all extremities well. Mild to moderate incisional discomfort, no radicular discomfort. Dressing clean and dry. Foley to straight drainage.  Plan: We'll begin to mobilize once alert, and advance as tolerated. We'll DC Foley once up and about.  Hewitt Shorts, MD 03/24/2013, 3:28 PM

## 2013-03-24 NOTE — H&P (Signed)
Subjective: Patient is a 72 y.o. male who is admitted for treatment of left-sided low back and left lumbar degenerative pain into the left buttock and posterior thigh. Patient's been studied with CT scan (he cannot undergo MRI because of shrapnel) which shows advanced left L5-S1 facet arthropathy, with some left L5-S1 disc bulging, and resulting left L5-S1 lateral recess and neural foraminal stenosis.patient is admitted now for a left L5-S1 lumbar laminotomy, facetectomy, and foraminotomy, with possible microdiscectomy, and bilateral L5-S1 posterolateral arthrodesis with posterior instrumentation and bone graft.  Past Medical History  Diagnosis Date  . Hypertension   . Chronic kidney disease     kidney stones  . GERD (gastroesophageal reflux disease)     occasionally    Past Surgical History  Procedure Laterality Date  . Tonsillectomy    . Appendectomy    . Neck surgery Left 1978    piece of metal removed  . Colonoscopy w/ biopsies and polypectomy  2013    Prescriptions prior to admission  Medication Sig Dispense Refill  . aspirin EC 81 MG tablet Take 81 mg by mouth daily.      Marland Kitchen ibuprofen (ADVIL,MOTRIN) 200 MG tablet Take 400 mg by mouth every 6 (six) hours as needed for pain.      Marland Kitchen losartan (COZAAR) 50 MG tablet Take 12.5 mg by mouth every other day.      . Naproxen Sodium (ALEVE) 220 MG CAPS Take 220 mg by mouth daily.      . silodosin (RAPAFLO) 8 MG CAPS capsule Take 8 mg by mouth daily with breakfast.       No Known Allergies  History  Substance Use Topics  . Smoking status: Former Smoker -- 1.00 packs/day for 10 years    Types: Cigarettes  . Smokeless tobacco: Never Used  . Alcohol Use: Yes     Comment: occasionally    No family history on file.   Review of Systems A comprehensive review of systems was negative.  Objective: Vital signs in last 24 hours: Temp:  [97 F (36.1 C)] 97 F (36.1 C) (10/29 0626) Pulse Rate:  [92] 92 (10/29 0626) Resp:  [18] 18 (10/29  0626) BP: (162)/(87) 162/87 mmHg (10/29 0626) SpO2:  [99 %] 99 % (10/29 0626)  EXAM: Patient is a well-developed well-nourished white male in no acute distress.  Lungs clear to auscultation, he has symmetrical respiratory excursion.  Heart is regular rate and rhythm, normal S1 and S2, no murmur.  Abdomen soft, nondistended, bowel tones are present.  Extremity examination shows no clubbing, cyanosis, or edema.  Musculoskeletal examination shows no tenderness to palpation over the lumbar spinous processes or paralumbar musculature.  He is able to flex to 90, and able to extend to 5, but has discomfort with extension.  Straight leg raising is negative bilaterally. Neurologic examination shows 5/5 strength in the lower extremities including the iliopsoas, quadriceps, dorsiflexors, EHL, and plantar flexors bilaterally.  Sensation is intact to pinprick in the distal lower extremities.  Reflexes are 2 at the quadriceps and gastrocnemius, and symmetrical bilateral.  Toes are downgoing.  He has a normal gait and stance.  Data Review:CBC    Component Value Date/Time   WBC 5.7 03/18/2013 1248   RBC 4.59 03/18/2013 1248   HGB 15.1 03/18/2013 1248   HCT 43.4 03/18/2013 1248   PLT 233 03/18/2013 1248   MCV 94.6 03/18/2013 1248   MCH 32.9 03/18/2013 1248   MCHC 34.8 03/18/2013 1248   RDW 12.6 03/18/2013 1248  BMET    Component Value Date/Time   NA 137 03/18/2013 1300   K 3.8 03/18/2013 1300   CL 101 03/18/2013 1300   CO2 25 03/18/2013 1300   GLUCOSE 100* 03/18/2013 1300   BUN 19 03/18/2013 1300   CREATININE 0.91 03/18/2013 1300   CALCIUM 9.1 03/18/2013 1300   GFRNONAA 84* 03/18/2013 1300   GFRAA >90 03/18/2013 1300     Assessment/Plan: Patient with persistent left-sided low back pain radiating into the left buttock and posterior thigh.patient has advanced left L5-S1 facet arthropathy with some left L5-S1 disc bulging and resultant left L5-S1 lateral recess and neural  foramina stenosis.  Patient is admitted now for left L5-S1 lumbar laminotomy, facetectomy, and foraminotomy, with possible microdiscectomy, and bilateral L5-S1 posterolateral arthrodesis with posterior instrumentation and bone graft.  I discussed the nature of the patient's condition, and the nature of the surgical procedure, with discussed alternatives to surgery, and typical in for surgery, hospital stay, and overall recuperation.  We've discussed the need for postoperative immobilization in a lumbar brace.  We discussed risks of surgery including risks of infection, bleeding, possibly for transfusion, the risk of nerve root dysfunction resulting in pain, weakness, numbness, or tingling.  We discussed risks of dural tear and CSF leakage, and possibly needing further surgery.  We discussed risk of failure of the arthrodesis and possibly further surgery.  And we discussed anesthetic risks of myocardial infarction, stroke, pneumonia, and death.after discussing all this, and having answered the patient's questions, he does wish to proceed with surgery and is admitted for such.   Hewitt Shorts, MD 03/24/2013 6:42 AM

## 2013-03-24 NOTE — Op Note (Signed)
03/24/2013  12:01 PM  PATIENT:  Brent Hanna  72 y.o. 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  ANESTHESIA:   general  EBL:  Total I/O In: 1250 [I.V.:1000; IV Piggyback:250] Out: 425 [Urine:325; Blood:100]  BLOOD ADMINISTERED:none  CELL SAVER GIVEN: Cell Saver technician felt that there was insufficient blood loss to process the collected blood  COUNT: Correct per nursing staff  DICTATION: Patient is brought to the operating room placed under general endotracheal anesthesia. The patient was turned to prone position the lumbar region was prepped with Betadine soap and solution and draped in a sterile fashion. The midline was infiltrated with local anesthesia with epinephrine. A localizing x-ray was taken and then a midline incision was made carried down through the subcutaneous tissue, bipolar cautery and electrocautery were used to maintain hemostasis. Dissection was carried down to the lumbar fascia. The fascia was incised bilaterally and the paraspinal muscles were dissected with a spinous process and lamina in a subperiosteal fashion. Another x-ray was taken for localization and the L5-S1 level was localized. Dissection was then carried out laterally over the facet complex and the transverse processes of L5 and the ala of S1 were exposed and decorticated. We then proceeded with a left-sided  decompression at L5-S1. A left L5-S1 laminotomy, facetectomy, and foraminotomy was performed using the high-speed drill and Kerrison punches. There was marked hypertrophic spondylitic overgrowth causing lateral recess stenosis with compression of the exiting left S1 nerve root and left L5-S1 neural foraminal stenosis with compression of the exiting left L5 nerve root. This complete facetectomy and foraminotomy achieved decompression of the stenotic compression of the exiting left L5 and S1 nerve roots. Once the decompression stenotic compression of the thecal sac and exiting nerve roots was completed we proceeded with the posterior lateral arthrodesis.   The C-arm fluoroscope was then draped and brought in the field and we identified the pedicle entry points bilaterally at the L5 and S1 levels. Each of the 4 pedicles was probed, we aspirated bone marrow aspirate from the vertebral bodies, this was injected over a 10 cc strip of Vitoss BA. Then each of the pedicles was examined with the ball probe, good bony surfaces were found and no bony cuts were found. Each of the pedicles was then tapped with a 5.25 mm tap, again examined with the ball probe good threading was found and no bony cuts were found. We then placed 5.75 by 35 millimeter screws bilaterally at each level.  We then packed the lateral gutter over the transverse processes and intertransverse space with Vitoss BA with bone marrow aspirate. We then selected pre-lordosed rods, using a 30 mm rod on the left and a 35 overall the right, they were placed within the screw heads and secured with locking caps once all 4 locking caps were placed final tightening was performed against a counter torque.  The wound had been irrigated multiple times during the procedure with saline solution and bacitracin solution, good hemostasis was established with a combination of bipolar cautery and Gelfoam with thrombin. Once good  hemostasis was confirmed we proceeded with  closure paraspinal muscles deep fascia and Scarpa's fascia were closed with interrupted undyed 1 Vicryl sutures the subcutaneous and subcuticular closed with interrupted inverted 2-0 undyed Vicryl sutures the skin edges were approximated with Dermabond.  The wound was dressed with sterile gauze and Hypafix.  Following surgery the patient was turned back to the supine position to be reversed and the anesthetic extubated and transferred to the recovery room for further care.   PLAN OF CARE: Admit to inpatient   PATIENT DISPOSITION:  PACU - hemodynamically stable.   Delay start of Pharmacological VTE agent (>24hrs) due to surgical blood loss or risk of bleeding:  yes

## 2013-03-24 NOTE — Transfer of Care (Signed)
Immediate Anesthesia Transfer of Care Note  Patient: Brent Hanna  Procedure(s) Performed: Procedure(s) with comments: LEFT LUMBAR FIVE-SACRAL ONE LUMBAR LAMINECTOMY,FACETECTOMY,FORAMINOTOMY AND POSSIBLE MICRODISKECTOMY,POSTERIOR LATERAL ARTHRODESIS (Left) - left  Patient Location: PACU  Anesthesia Type:General  Level of Consciousness: lethargic and responds to stimulation  Airway & Oxygen Therapy: Patient Spontanous Breathing and Patient connected to nasal cannula oxygen  Post-op Assessment: Report given to PACU RN, Post -op Vital signs reviewed and stable and Patient moving all extremities X 4  Post vital signs: Reviewed and stable  Complications: No apparent anesthesia complications

## 2013-03-24 NOTE — Anesthesia Postprocedure Evaluation (Signed)
  Anesthesia Post-op Note  Patient: Brent Hanna  Procedure(s) Performed: Procedure(s) with comments: LEFT LUMBAR FIVE-SACRAL ONE LUMBAR LAMINECTOMY,FACETECTOMY,FORAMINOTOMY AND POSSIBLE MICRODISKECTOMY,POSTERIOR LATERAL ARTHRODESIS (Left) - left  Patient Location: PACU  Anesthesia Type:General  Level of Consciousness: awake and alert   Airway and Oxygen Therapy: Patient Spontanous Breathing  Post-op Pain: mild  Post-op Assessment: Post-op Vital signs reviewed, Patient's Cardiovascular Status Stable and Respiratory Function Stable  Post-op Vital Signs: Reviewed  Filed Vitals:   03/24/13 1415  BP: 110/63  Pulse: 99  Temp:   Resp: 13    Complications: No apparent anesthesia complications

## 2013-03-24 NOTE — Progress Notes (Signed)
UR COMPLETED  

## 2013-03-25 NOTE — Progress Notes (Signed)
Filed Vitals:   03/24/13 1515 03/24/13 1930 03/24/13 2314 03/25/13 0322  BP: 127/70 118/71 98/64 112/72  Pulse: 106 110 104 75  Temp: 98.5 F (36.9 C) 98.6 F (37 C) 98.9 F (37.2 C) 98.8 F (37.1 C)  TempSrc:  Oral Oral Oral  Resp: 18 16 16 16   Height:      Weight:      SpO2: 93% 94% 93% 93%    Patient's fairly comfortable, has ambulated some. Foley DC'd at 0500, no void so far. Dressing clean and dry. Moving all extremities well.  Plan: Continued to progress through postoperative recovery, nursing staff to monitor voiding function and check PVR with bladder scan if needed. Encouraged patient to increase ambulation in the halls  Hewitt Shorts, MD 03/25/2013, 8:05 AM

## 2013-03-25 NOTE — Progress Notes (Signed)
Pt and wife given D/C instructions with Rx, verbal understanding was given. Pt D/C'd home via wheelchair @ 1740 per MD order. Rema Fendt, RN

## 2013-03-26 ENCOUNTER — Encounter (HOSPITAL_COMMUNITY): Payer: Self-pay | Admitting: Neurosurgery

## 2013-03-26 DIAGNOSIS — R109 Unspecified abdominal pain: Secondary | ICD-10-CM | POA: Diagnosis not present

## 2013-03-26 DIAGNOSIS — R339 Retention of urine, unspecified: Secondary | ICD-10-CM | POA: Diagnosis not present

## 2013-03-26 MED FILL — Heparin Sodium (Porcine) Inj 1000 Unit/ML: INTRAMUSCULAR | Qty: 30 | Status: AC

## 2013-03-26 MED FILL — Sodium Chloride IV Soln 0.9%: INTRAVENOUS | Qty: 1000 | Status: AC

## 2013-03-26 NOTE — Discharge Summary (Signed)
Physician Discharge Summary  Patient ID: Brent Hanna MRN: [id number] DOB/AGE: 06-16-1940 73 y.o.  Admit date: 03/24/2013 Discharge date: 03/26/2013  Admission Diagnoses:  Left L5-S1 lateral recess and neural foraminal stenosis secondary to hypertrophic facet arthropathy (lumbar spondylosis) with resulting left-sided low back pain and radiculopathy  Discharge Diagnoses:   Left L5-S1 lateral recess and neural foraminal stenosis secondary to hypertrophic facet arthropathy (lumbar spondylosis) with resulting left-sided low back pain and radiculopathy  Discharged Condition: good  Hospital Course: Patient admitted underwent a left L5-S1 lumbar laminotomy, facetectomy, and foraminotomy with decompression of the neural foraminal and lateral recess stenosis of the left L5 and S1 nerve roots respectively, with bilateral L5-S1 posterior arthrodesis with posterior instrumentation and bone graft. Postoperatively he had good relief of the left-sided back and radicular pain, and had only mild incisional discomfort. He was up and ambulating in the hallways. He was voiding, although with mild to moderate residuals, but he has a history of bladder outlet difficulties and is on Flomax at home. He was comfortable and asking to be discharged to home. He was given instructions regarding wound care and activities following discharge. He is return for followup with me in 3 weeks. He was given a for Percocet was tablets by mouth Q4 to 6 hours when necessary pain, 60 tablets no refills.  Discharge Exam: Blood pressure 131/75, pulse 102, temperature 97.7 F (36.5 C), temperature source Oral, resp. rate 16, height 5' 8.9" (1.75 m), weight 82.8 kg (182 lb 8.7 oz), SpO2 92.00%.  Disposition: Home     Medication List    ASK your doctor about these medications       ALEVE 220 MG Caps  Generic drug:  Naproxen Sodium  Take 220 mg by mouth daily.     aspirin EC 81 MG tablet  Take 81 mg by mouth daily.     ibuprofen 200 MG tablet  Commonly known as:  ADVIL,MOTRIN  Take 400 mg by mouth every 6 (six) hours as needed for pain.     losartan 50 MG tablet  Commonly known as:  COZAAR  Take 12.5 mg by mouth every other day.     silodosin 8 MG Caps capsule  Commonly known as:  RAPAFLO  Take 8 mg by mouth daily with breakfast.         Signed: Hewitt Shorts, MD 03/26/2013, 8:14 AM

## 2013-03-29 DIAGNOSIS — N401 Enlarged prostate with lower urinary tract symptoms: Secondary | ICD-10-CM | POA: Diagnosis not present

## 2013-03-29 DIAGNOSIS — R339 Retention of urine, unspecified: Secondary | ICD-10-CM | POA: Diagnosis not present

## 2013-04-07 DIAGNOSIS — H2510 Age-related nuclear cataract, unspecified eye: Secondary | ICD-10-CM | POA: Diagnosis not present

## 2013-04-07 DIAGNOSIS — H52209 Unspecified astigmatism, unspecified eye: Secondary | ICD-10-CM | POA: Diagnosis not present

## 2013-04-07 DIAGNOSIS — H33109 Unspecified retinoschisis, unspecified eye: Secondary | ICD-10-CM | POA: Diagnosis not present

## 2013-04-15 DIAGNOSIS — H33109 Unspecified retinoschisis, unspecified eye: Secondary | ICD-10-CM | POA: Diagnosis not present

## 2013-04-16 DIAGNOSIS — M545 Low back pain, unspecified: Secondary | ICD-10-CM | POA: Diagnosis not present

## 2013-06-15 DIAGNOSIS — E669 Obesity, unspecified: Secondary | ICD-10-CM | POA: Diagnosis not present

## 2013-06-15 DIAGNOSIS — I10 Essential (primary) hypertension: Secondary | ICD-10-CM | POA: Diagnosis not present

## 2013-06-15 DIAGNOSIS — M545 Low back pain, unspecified: Secondary | ICD-10-CM | POA: Diagnosis not present

## 2013-06-15 DIAGNOSIS — M48061 Spinal stenosis, lumbar region without neurogenic claudication: Secondary | ICD-10-CM | POA: Diagnosis not present

## 2013-06-16 ENCOUNTER — Ambulatory Visit: Payer: Medicare Other | Attending: Neurosurgery | Admitting: Rehabilitation

## 2013-06-16 DIAGNOSIS — IMO0001 Reserved for inherently not codable concepts without codable children: Secondary | ICD-10-CM | POA: Diagnosis not present

## 2013-06-16 DIAGNOSIS — M545 Low back pain, unspecified: Secondary | ICD-10-CM | POA: Diagnosis not present

## 2013-06-21 ENCOUNTER — Ambulatory Visit: Payer: Medicare Other | Admitting: Rehabilitation

## 2013-06-24 ENCOUNTER — Ambulatory Visit: Payer: Medicare Other | Admitting: Rehabilitation

## 2013-06-28 ENCOUNTER — Ambulatory Visit: Payer: Medicare Other | Attending: Neurosurgery | Admitting: Rehabilitation

## 2013-06-28 DIAGNOSIS — M545 Low back pain, unspecified: Secondary | ICD-10-CM | POA: Insufficient documentation

## 2013-06-28 DIAGNOSIS — IMO0001 Reserved for inherently not codable concepts without codable children: Secondary | ICD-10-CM | POA: Insufficient documentation

## 2013-06-29 DIAGNOSIS — I10 Essential (primary) hypertension: Secondary | ICD-10-CM | POA: Diagnosis not present

## 2013-06-29 DIAGNOSIS — Z125 Encounter for screening for malignant neoplasm of prostate: Secondary | ICD-10-CM | POA: Diagnosis not present

## 2013-07-01 ENCOUNTER — Ambulatory Visit: Payer: Medicare Other | Admitting: Rehabilitation

## 2013-07-02 DIAGNOSIS — Z1212 Encounter for screening for malignant neoplasm of rectum: Secondary | ICD-10-CM | POA: Diagnosis not present

## 2013-07-05 ENCOUNTER — Ambulatory Visit: Payer: Medicare Other | Admitting: Rehabilitation

## 2013-07-06 DIAGNOSIS — Z1331 Encounter for screening for depression: Secondary | ICD-10-CM | POA: Diagnosis not present

## 2013-07-06 DIAGNOSIS — F39 Unspecified mood [affective] disorder: Secondary | ICD-10-CM | POA: Diagnosis not present

## 2013-07-06 DIAGNOSIS — I10 Essential (primary) hypertension: Secondary | ICD-10-CM | POA: Diagnosis not present

## 2013-07-06 DIAGNOSIS — N200 Calculus of kidney: Secondary | ICD-10-CM | POA: Diagnosis not present

## 2013-07-06 DIAGNOSIS — N401 Enlarged prostate with lower urinary tract symptoms: Secondary | ICD-10-CM | POA: Diagnosis not present

## 2013-07-06 DIAGNOSIS — IMO0002 Reserved for concepts with insufficient information to code with codable children: Secondary | ICD-10-CM | POA: Diagnosis not present

## 2013-07-06 DIAGNOSIS — K219 Gastro-esophageal reflux disease without esophagitis: Secondary | ICD-10-CM | POA: Diagnosis not present

## 2013-07-06 DIAGNOSIS — M1990 Unspecified osteoarthritis, unspecified site: Secondary | ICD-10-CM | POA: Diagnosis not present

## 2013-07-06 DIAGNOSIS — Z0000 Encounter for general adult medical examination without abnormal findings: Secondary | ICD-10-CM | POA: Diagnosis not present

## 2013-07-08 ENCOUNTER — Ambulatory Visit: Payer: Medicare Other | Admitting: Rehabilitation

## 2013-07-09 DIAGNOSIS — N401 Enlarged prostate with lower urinary tract symptoms: Secondary | ICD-10-CM | POA: Diagnosis not present

## 2013-07-09 DIAGNOSIS — N138 Other obstructive and reflux uropathy: Secondary | ICD-10-CM | POA: Diagnosis not present

## 2013-07-12 ENCOUNTER — Ambulatory Visit: Payer: Medicare Other | Admitting: Rehabilitation

## 2013-07-15 ENCOUNTER — Other Ambulatory Visit: Payer: Self-pay | Admitting: Urology

## 2013-07-15 ENCOUNTER — Ambulatory Visit: Payer: Medicare Other | Admitting: Rehabilitation

## 2013-07-15 DIAGNOSIS — H33109 Unspecified retinoschisis, unspecified eye: Secondary | ICD-10-CM | POA: Diagnosis not present

## 2013-07-23 ENCOUNTER — Encounter (HOSPITAL_COMMUNITY): Payer: Self-pay | Admitting: Pharmacy Technician

## 2013-07-28 ENCOUNTER — Encounter (HOSPITAL_COMMUNITY)
Admission: RE | Admit: 2013-07-28 | Discharge: 2013-07-28 | Disposition: A | Discharge status: Home / Self Care | Payer: Medicare Other | Source: Ambulatory Visit | Attending: Urology | Admitting: Urology

## 2013-07-28 ENCOUNTER — Encounter (HOSPITAL_COMMUNITY): Payer: Self-pay

## 2013-07-28 DIAGNOSIS — Z01812 Encounter for preprocedural laboratory examination: Secondary | ICD-10-CM | POA: Insufficient documentation

## 2013-07-28 HISTORY — DX: Benign prostatic hyperplasia without lower urinary tract symptoms: N40.0

## 2013-07-28 HISTORY — DX: Unspecified osteoarthritis, unspecified site: M19.90

## 2013-07-28 HISTORY — DX: Pain, unspecified: R52

## 2013-07-28 HISTORY — DX: Personal history of urinary calculi: Z87.442

## 2013-07-28 LAB — CBC
HCT: 45.5 % (ref 39.0–52.0)
Hemoglobin: 15.6 g/dL (ref 13.0–17.0)
MCH: 31.6 pg (ref 26.0–34.0)
MCHC: 34.3 g/dL (ref 30.0–36.0)
MCV: 92.1 fL (ref 78.0–100.0)
Platelets: 213 10*3/uL (ref 150–400)
RBC: 4.94 MIL/uL (ref 4.22–5.81)
RDW: 13.4 % (ref 11.5–15.5)
WBC: 4.4 10*3/uL (ref 4.0–10.5)

## 2013-07-28 LAB — BASIC METABOLIC PANEL
BUN: 17 mg/dL (ref 6–23)
CO2: 26 mEq/L (ref 19–32)
Calcium: 9.3 mg/dL (ref 8.4–10.5)
Chloride: 104 mEq/L (ref 96–112)
Creatinine, Ser: 0.91 mg/dL (ref 0.50–1.35)
GFR calc Af Amer: >90 mL/min (ref >90)
GFR calc non Af Amer: 84 mL/min — ABNORMAL LOW (ref >90)
Glucose, Bld: 106 mg/dL — ABNORMAL HIGH (ref 70–99)
Potassium: 4.8 mEq/L (ref 3.7–5.3)
Sodium: 142 mEq/L (ref 137–147)

## 2013-07-28 NOTE — Pre-Procedure Instructions (Signed)
EKG AND CXR REPORTS ARE IN EPIC FROM 03-18-13.

## 2013-07-28 NOTE — Patient Instructions (Signed)
   YOUR SURGERY IS SCHEDULED AT [facility]  ON:  Monday  3/9  REPORT TO  SHORT STAY CENTER AT:   7:00 AM      PHONE # FOR SHORT STAY IS [phone]  DO NOT EAT OR DRINK ANYTHING AFTER MIDNIGHT THE NIGHT BEFORE YOUR SURGERY.  YOU MAY BRUSH YOUR TEETH, RINSE OUT YOUR MOUTH--BUT NO WATER, NO FOOD, NO CHEWING GUM, NO MINTS, NO CANDIES, NO CHEWING TOBACCO.  PLEASE TAKE THE FOLLOWING MEDICATIONS THE AM OF YOUR SURGERY WITH A FEW SIPS OF WATER:   RAPAFLO,, PRILOSEC   DO NOT BRING VALUABLES, MONEY, CREDIT CARDS.  DO NOT WEAR JEWELRY, MAKE-UP, NAIL POLISH AND NO METAL PINS OR CLIPS IN YOUR HAIR. CONTACT LENS, DENTURES / PARTIALS, GLASSES SHOULD NOT BE WORN TO SURGERY AND IN MOST CASES-HEARING AIDS WILL NEED TO BE REMOVED.  BRING YOUR GLASSES CASE, ANY EQUIPMENT NEEDED FOR YOUR CONTACT LENS. FOR PATIENTS ADMITTED TO THE HOSPITAL--CHECK OUT TIME THE DAY OF DISCHARGE IS 11:00 AM.  ALL INPATIENT ROOMS ARE PRIVATE - WITH BATHROOM, TELEPHONE, TELEVISION AND WIFI INTERNET.    FAILURE TO FOLLOW THESE INSTRUCTIONS MAY RESULT IN THE CANCELLATION OF YOUR SURGERY. PLEASE BE AWARE THAT YOU MAY NEED ADDITIONAL BLOOD DRAWN DAY OF YOUR SURGERY  PATIENT SIGNATURE_________________________________

## 2013-08-02 ENCOUNTER — Encounter (HOSPITAL_COMMUNITY)
Admission: RE | Disposition: A | Discharge status: Home / Self Care | Payer: Self-pay | Source: Ambulatory Visit | Attending: Urology

## 2013-08-02 ENCOUNTER — Inpatient Hospital Stay (HOSPITAL_COMMUNITY)
Admission: RE | Admit: 2013-08-02 | Discharge: 2013-08-05 | DRG: 713 | Disposition: A | Discharge status: Home / Self Care | Payer: Medicare Other | Source: Ambulatory Visit | Attending: Urology | Admitting: Urology

## 2013-08-02 ENCOUNTER — Inpatient Hospital Stay (HOSPITAL_COMMUNITY): Payer: Medicare Other | Admitting: Anesthesiology

## 2013-08-02 ENCOUNTER — Encounter (HOSPITAL_COMMUNITY): Payer: Medicare Other | Admitting: Anesthesiology

## 2013-08-02 ENCOUNTER — Ambulatory Visit (HOSPITAL_COMMUNITY): Payer: Medicare Other

## 2013-08-02 ENCOUNTER — Encounter (HOSPITAL_COMMUNITY): Payer: Self-pay | Admitting: *Deleted

## 2013-08-02 DIAGNOSIS — Z7982 Long term (current) use of aspirin: Secondary | ICD-10-CM

## 2013-08-02 DIAGNOSIS — Z79899 Other long term (current) drug therapy: Secondary | ICD-10-CM | POA: Diagnosis not present

## 2013-08-02 DIAGNOSIS — E872 Acidosis, unspecified: Secondary | ICD-10-CM | POA: Diagnosis not present

## 2013-08-02 DIAGNOSIS — Z87891 Personal history of nicotine dependence: Secondary | ICD-10-CM

## 2013-08-02 DIAGNOSIS — N3289 Other specified disorders of bladder: Secondary | ICD-10-CM | POA: Diagnosis not present

## 2013-08-02 DIAGNOSIS — K219 Gastro-esophageal reflux disease without esophagitis: Secondary | ICD-10-CM | POA: Diagnosis present

## 2013-08-02 DIAGNOSIS — Z8042 Family history of malignant neoplasm of prostate: Secondary | ICD-10-CM | POA: Diagnosis not present

## 2013-08-02 DIAGNOSIS — N400 Enlarged prostate without lower urinary tract symptoms: Secondary | ICD-10-CM | POA: Diagnosis present

## 2013-08-02 DIAGNOSIS — Y849 Medical procedure, unspecified as the cause of abnormal reaction of the patient, or of later complication, without mention of misadventure at the time of the procedure: Secondary | ICD-10-CM | POA: Diagnosis not present

## 2013-08-02 DIAGNOSIS — I517 Cardiomegaly: Secondary | ICD-10-CM | POA: Diagnosis not present

## 2013-08-02 DIAGNOSIS — N138 Other obstructive and reflux uropathy: Principal | ICD-10-CM | POA: Diagnosis present

## 2013-08-02 DIAGNOSIS — Z800 Family history of malignant neoplasm of digestive organs: Secondary | ICD-10-CM | POA: Diagnosis not present

## 2013-08-02 DIAGNOSIS — N200 Calculus of kidney: Secondary | ICD-10-CM | POA: Diagnosis present

## 2013-08-02 DIAGNOSIS — E861 Hypovolemia: Secondary | ICD-10-CM | POA: Diagnosis not present

## 2013-08-02 DIAGNOSIS — I10 Essential (primary) hypertension: Secondary | ICD-10-CM | POA: Diagnosis present

## 2013-08-02 DIAGNOSIS — I6529 Occlusion and stenosis of unspecified carotid artery: Secondary | ICD-10-CM | POA: Diagnosis present

## 2013-08-02 DIAGNOSIS — N139 Obstructive and reflux uropathy, unspecified: Secondary | ICD-10-CM | POA: Diagnosis present

## 2013-08-02 DIAGNOSIS — I959 Hypotension, unspecified: Secondary | ICD-10-CM

## 2013-08-02 DIAGNOSIS — R339 Retention of urine, unspecified: Secondary | ICD-10-CM | POA: Diagnosis present

## 2013-08-02 DIAGNOSIS — R55 Syncope and collapse: Secondary | ICD-10-CM | POA: Diagnosis not present

## 2013-08-02 DIAGNOSIS — N401 Enlarged prostate with lower urinary tract symptoms: Principal | ICD-10-CM | POA: Diagnosis present

## 2013-08-02 DIAGNOSIS — I498 Other specified cardiac arrhythmias: Secondary | ICD-10-CM | POA: Diagnosis present

## 2013-08-02 DIAGNOSIS — R918 Other nonspecific abnormal finding of lung field: Secondary | ICD-10-CM | POA: Diagnosis not present

## 2013-08-02 DIAGNOSIS — D649 Anemia, unspecified: Secondary | ICD-10-CM | POA: Diagnosis not present

## 2013-08-02 DIAGNOSIS — Z87442 Personal history of urinary calculi: Secondary | ICD-10-CM | POA: Diagnosis not present

## 2013-08-02 DIAGNOSIS — R310 Gross hematuria: Secondary | ICD-10-CM | POA: Diagnosis present

## 2013-08-02 DIAGNOSIS — IMO0002 Reserved for concepts with insufficient information to code with codable children: Secondary | ICD-10-CM | POA: Diagnosis not present

## 2013-08-02 DIAGNOSIS — N4289 Other specified disorders of prostate: Secondary | ICD-10-CM | POA: Diagnosis not present

## 2013-08-02 HISTORY — PX: TRANSURETHRAL RESECTION OF PROSTATE: SHX73

## 2013-08-02 LAB — BASIC METABOLIC PANEL
BUN: 16 mg/dL (ref 6–23)
CO2: 21 mEq/L (ref 19–32)
Calcium: 8.6 mg/dL (ref 8.4–10.5)
Chloride: 101 mEq/L (ref 96–112)
Creatinine, Ser: 0.88 mg/dL (ref 0.50–1.35)
GFR calc Af Amer: >90 mL/min (ref >90)
GFR calc non Af Amer: 85 mL/min — ABNORMAL LOW (ref >90)
Glucose, Bld: 249 mg/dL — ABNORMAL HIGH (ref 70–99)
Potassium: 4.5 mEq/L (ref 3.7–5.3)
Sodium: 138 mEq/L (ref 137–147)

## 2013-08-02 LAB — CBC WITH DIFFERENTIAL/PLATELET
Basophils Absolute: 0 10*3/uL (ref 0.0–0.1)
Basophils Relative: 0 % (ref 0–1)
Eosinophils Absolute: 0 10*3/uL (ref 0.0–0.7)
Eosinophils Relative: 0 % (ref 0–5)
HCT: 38.2 % — ABNORMAL LOW (ref 39.0–52.0)
Hemoglobin: 13.2 g/dL (ref 13.0–17.0)
Lymphocytes Relative: 17 % (ref 12–46)
Lymphs Abs: 1.7 10*3/uL (ref 0.7–4.0)
MCH: 31.5 pg (ref 26.0–34.0)
MCHC: 34.6 g/dL (ref 30.0–36.0)
MCV: 91.2 fL (ref 78.0–100.0)
Monocytes Absolute: 0.2 10*3/uL (ref 0.1–1.0)
Monocytes Relative: 2 % — ABNORMAL LOW (ref 3–12)
Neutro Abs: 8.4 10*3/uL — ABNORMAL HIGH (ref 1.7–7.7)
Neutrophils Relative %: 81 % — ABNORMAL HIGH (ref 43–77)
Platelets: 241 10*3/uL (ref 150–400)
RBC: 4.19 MIL/uL — ABNORMAL LOW (ref 4.22–5.81)
RDW: 13.3 % (ref 11.5–15.5)
WBC: 10.3 10*3/uL (ref 4.0–10.5)

## 2013-08-02 LAB — TROPONIN I: Troponin I: <0.3 ng/mL (ref <0.30)

## 2013-08-02 LAB — BLOOD GAS, ARTERIAL
Acid-base deficit: 4 mmol/L — ABNORMAL HIGH (ref 0.0–2.0)
Bicarbonate: 20.8 mEq/L (ref 20.0–24.0)
Drawn by: 103701
O2 Content: 6 L/min
O2 Saturation: 98.6 %
Patient temperature: 98.6
TCO2: 19.2 mmol/L (ref 0–100)
pCO2 arterial: 39.4 mmHg (ref 35.0–45.0)
pH, Arterial: 7.343 — ABNORMAL LOW (ref 7.350–7.450)
pO2, Arterial: 136 mmHg — ABNORMAL HIGH (ref 80.0–100.0)

## 2013-08-02 LAB — GLUCOSE, CAPILLARY: Glucose-Capillary: 219 mg/dL — ABNORMAL HIGH (ref 70–99)

## 2013-08-02 LAB — LACTIC ACID, PLASMA
Lactic Acid, Venous: 4.5 mmol/L — ABNORMAL HIGH (ref 0.5–2.2)
Lactic Acid, Venous: 4.3 mmol/L — ABNORMAL HIGH (ref 0.5–2.2)

## 2013-08-02 SURGERY — TRANSURETHRAL RESECTION OF THE PROSTATE WITH GYRUS INSTRUMENTS
Anesthesia: General

## 2013-08-02 MED ORDER — ACETAMINOPHEN 325 MG PO TABS: 650.0000 mg | ORAL_TABLET | ORAL | Status: DC | PRN

## 2013-08-02 MED ORDER — FENTANYL CITRATE 0.05 MG/ML IJ SOLN
INTRAMUSCULAR | Status: AC
  Filled 2013-08-02: qty 2

## 2013-08-02 MED ORDER — OXYCODONE-ACETAMINOPHEN 5-325 MG PO TABS
1.0000 | ORAL_TABLET | ORAL | Status: DC | PRN
  Administered 2013-08-02 – 2013-08-05 (×4): 2 via ORAL
  Filled 2013-08-02 (×4): qty 2

## 2013-08-02 MED ORDER — BELLADONNA ALKALOIDS-OPIUM 16.2-60 MG RE SUPP
RECTAL | Status: AC
  Filled 2013-08-02: qty 1

## 2013-08-02 MED ORDER — PROPOFOL 10 MG/ML IV BOLUS
INTRAVENOUS | Status: AC
  Filled 2013-08-02: qty 20

## 2013-08-02 MED ORDER — OXYBUTYNIN CHLORIDE 5 MG PO TABS
5.0000 mg | ORAL_TABLET | Freq: Three times a day (TID) | ORAL | Status: DC | PRN
  Filled 2013-08-02: qty 1

## 2013-08-02 MED ORDER — DEXAMETHASONE SODIUM PHOSPHATE 10 MG/ML IJ SOLN
INTRAMUSCULAR | Status: DC | PRN
  Administered 2013-08-02: 10 mg via INTRAVENOUS

## 2013-08-02 MED ORDER — ONDANSETRON HCL 4 MG/2ML IJ SOLN
4.0000 mg | Freq: Once | INTRAMUSCULAR | Status: AC
  Administered 2013-08-02: 4 mg via INTRAVENOUS
  Filled 2013-08-02 (×2): qty 2

## 2013-08-02 MED ORDER — BIOTENE DRY MOUTH MT LIQD
15.0000 mL | Freq: Two times a day (BID) | OROMUCOSAL | Status: DC
  Administered 2013-08-02 – 2013-08-05 (×6): 15 mL via OROMUCOSAL

## 2013-08-02 MED ORDER — ONDANSETRON HCL 4 MG/2ML IJ SOLN
INTRAMUSCULAR | Status: AC
  Filled 2013-08-02: qty 2

## 2013-08-02 MED ORDER — CEFAZOLIN SODIUM-DEXTROSE 2-3 GM-% IV SOLR
2.0000 g | INTRAVENOUS | Status: AC
  Administered 2013-08-02: 2 g via INTRAVENOUS

## 2013-08-02 MED ORDER — EPHEDRINE SULFATE 50 MG/ML IJ SOLN
INTRAMUSCULAR | Status: DC | PRN
  Administered 2013-08-02 (×3): 5 mg via INTRAVENOUS

## 2013-08-02 MED ORDER — FENTANYL CITRATE 0.05 MG/ML IJ SOLN
25.0000 ug | INTRAMUSCULAR | Status: DC | PRN
  Administered 2013-08-02: 50 ug via INTRAVENOUS
  Administered 2013-08-02 (×2): 25 ug via INTRAVENOUS
  Administered 2013-08-02: 50 ug via INTRAVENOUS

## 2013-08-02 MED ORDER — OMEPRAZOLE MAGNESIUM 20 MG PO TBEC: 20.0000 mg | DELAYED_RELEASE_TABLET | Freq: Every day | ORAL | Status: DC | PRN

## 2013-08-02 MED ORDER — LOSARTAN POTASSIUM 25 MG PO TABS
25.0000 mg | ORAL_TABLET | ORAL | Status: DC
  Filled 2013-08-02: qty 1

## 2013-08-02 MED ORDER — ONDANSETRON HCL 4 MG/2ML IJ SOLN
INTRAMUSCULAR | Status: DC | PRN
  Administered 2013-08-02: 4 mg via INTRAVENOUS

## 2013-08-02 MED ORDER — BELLADONNA ALKALOIDS-OPIUM 16.2-60 MG RE SUPP
RECTAL | Status: DC | PRN
  Administered 2013-08-02: 1 via RECTAL

## 2013-08-02 MED ORDER — BISACODYL 10 MG RE SUPP: 10.0000 mg | Freq: Every day | RECTAL | Status: DC | PRN

## 2013-08-02 MED ORDER — SODIUM CHLORIDE 0.9 % IR SOLN
Status: DC | PRN
  Administered 2013-08-02: 15000 mL via INTRAVESICAL

## 2013-08-02 MED ORDER — SENNOSIDES-DOCUSATE SODIUM 8.6-50 MG PO TABS
2.0000 | ORAL_TABLET | Freq: Every day | ORAL | Status: DC
  Administered 2013-08-03 – 2013-08-04 (×2): 2 via ORAL
  Filled 2013-08-02 (×4): qty 2

## 2013-08-02 MED ORDER — SODIUM CHLORIDE 0.9 % IR SOLN
3000.0000 mL | Status: DC
  Administered 2013-08-02 (×11): 3000 mL
  Administered 2013-08-02: 1000 mL
  Administered 2013-08-02 – 2013-08-03 (×16): 3000 mL

## 2013-08-02 MED ORDER — EPHEDRINE SULFATE 50 MG/ML IJ SOLN
INTRAMUSCULAR | Status: AC
  Filled 2013-08-02: qty 1

## 2013-08-02 MED ORDER — DIPHENHYDRAMINE HCL 12.5 MG/5ML PO ELIX: 12.5000 mg | ORAL_SOLUTION | Freq: Four times a day (QID) | ORAL | Status: DC | PRN

## 2013-08-02 MED ORDER — PROPOFOL 10 MG/ML IV BOLUS
INTRAVENOUS | Status: DC | PRN
  Administered 2013-08-02: 50 mg via INTRAVENOUS
  Administered 2013-08-02: 100 mg via INTRAVENOUS

## 2013-08-02 MED ORDER — CEFAZOLIN SODIUM-DEXTROSE 2-3 GM-% IV SOLR
INTRAVENOUS | Status: AC
  Filled 2013-08-02: qty 50

## 2013-08-02 MED ORDER — FENTANYL CITRATE 0.05 MG/ML IJ SOLN
25.0000 ug | INTRAMUSCULAR | Status: DC | PRN
  Administered 2013-08-02: 50 ug via INTRAVENOUS
  Administered 2013-08-02: 25 ug via INTRAVENOUS
  Administered 2013-08-02: 50 ug via INTRAVENOUS
  Administered 2013-08-02: 25 ug via INTRAVENOUS

## 2013-08-02 MED ORDER — LACTATED RINGERS IV SOLN
INTRAVENOUS | Status: DC
  Administered 2013-08-02: 1000 mL via INTRAVENOUS

## 2013-08-02 MED ORDER — HYDROMORPHONE HCL PF 1 MG/ML IJ SOLN
0.5000 mg | INTRAMUSCULAR | Status: DC | PRN
  Administered 2013-08-02 – 2013-08-03 (×5): 0.5 mg via INTRAVENOUS
  Filled 2013-08-02 (×3): qty 1

## 2013-08-02 MED ORDER — LACTATED RINGERS IV SOLN
INTRAVENOUS | Status: DC | PRN
  Administered 2013-08-02 (×2): via INTRAVENOUS

## 2013-08-02 MED ORDER — DEXAMETHASONE SODIUM PHOSPHATE 10 MG/ML IJ SOLN
INTRAMUSCULAR | Status: AC
  Filled 2013-08-02: qty 1

## 2013-08-02 MED ORDER — ONDANSETRON HCL 4 MG/2ML IJ SOLN
4.0000 mg | Freq: Once | INTRAMUSCULAR | Status: AC
  Administered 2013-08-02: 4 mg via INTRAMUSCULAR

## 2013-08-02 MED ORDER — IBUPROFEN 400 MG PO TABS
400.0000 mg | ORAL_TABLET | Freq: Four times a day (QID) | ORAL | Status: DC | PRN
  Filled 2013-08-02: qty 1

## 2013-08-02 MED ORDER — BACITRACIN-NEOMYCIN-POLYMYXIN 400-5-5000 EX OINT: 1.0000 "application " | TOPICAL_OINTMENT | Freq: Three times a day (TID) | CUTANEOUS | Status: DC | PRN

## 2013-08-02 MED ORDER — ONDANSETRON HCL 4 MG/2ML IJ SOLN
4.0000 mg | Freq: Four times a day (QID) | INTRAMUSCULAR | Status: DC | PRN
  Administered 2013-08-02 – 2013-08-03 (×2): 4 mg via INTRAVENOUS

## 2013-08-02 MED ORDER — PANTOPRAZOLE SODIUM 40 MG PO TBEC
40.0000 mg | DELAYED_RELEASE_TABLET | Freq: Every day | ORAL | Status: AC | PRN
  Administered 2013-08-03: 40 mg via ORAL
  Filled 2013-08-02: qty 1

## 2013-08-02 MED ORDER — ACETAMINOPHEN 10 MG/ML IV SOLN
1000.0000 mg | Freq: Once | INTRAVENOUS | Status: AC
  Administered 2013-08-02: 1000 mg via INTRAVENOUS
  Filled 2013-08-02: qty 100

## 2013-08-02 MED ORDER — DEXTROSE-NACL 5-0.45 % IV SOLN
INTRAVENOUS | Status: DC
  Administered 2013-08-02: 14:00:00 via INTRAVENOUS

## 2013-08-02 MED ORDER — KETOROLAC TROMETHAMINE 30 MG/ML IJ SOLN
INTRAMUSCULAR | Status: DC | PRN
  Administered 2013-08-02: 30 mg via INTRAVENOUS

## 2013-08-02 MED ORDER — NOREPINEPHRINE BITARTRATE 1 MG/ML IJ SOLN
2.0000 ug/min | INTRAVENOUS | Status: DC
  Filled 2013-08-02: qty 4

## 2013-08-02 MED ORDER — DIPHENHYDRAMINE HCL 50 MG/ML IJ SOLN: 12.5000 mg | Freq: Four times a day (QID) | INTRAMUSCULAR | Status: DC | PRN

## 2013-08-02 MED ORDER — FENTANYL CITRATE 0.05 MG/ML IJ SOLN
INTRAMUSCULAR | Status: DC | PRN
  Administered 2013-08-02 (×2): 25 ug via INTRAVENOUS
  Administered 2013-08-02: 50 ug via INTRAVENOUS
  Administered 2013-08-02 (×4): 25 ug via INTRAVENOUS

## 2013-08-02 MED ORDER — SODIUM CHLORIDE 0.9 % IJ SOLN
INTRAMUSCULAR | Status: AC
  Filled 2013-08-02: qty 10

## 2013-08-02 MED ORDER — SODIUM CHLORIDE 0.9 % IV SOLN
INTRAVENOUS | Status: DC
  Administered 2013-08-02 – 2013-08-05 (×4): via INTRAVENOUS

## 2013-08-02 MED ORDER — IOHEXOL 300 MG/ML  SOLN
100.0000 mL | Freq: Once | INTRAMUSCULAR | Status: AC | PRN
Start: 2013-08-02 — End: 2013-08-02
  Administered 2013-08-02: 100 mL via INTRAVENOUS

## 2013-08-02 MED ORDER — CIPROFLOXACIN HCL 500 MG PO TABS
500.0000 mg | ORAL_TABLET | Freq: Two times a day (BID) | ORAL | Status: DC
  Administered 2013-08-02 – 2013-08-05 (×6): 500 mg via ORAL
  Filled 2013-08-02 (×9): qty 1

## 2013-08-02 MED ORDER — PROMETHAZINE HCL 25 MG/ML IJ SOLN: 6.2500 mg | INTRAMUSCULAR | Status: DC | PRN

## 2013-08-02 MED ORDER — SODIUM CHLORIDE 0.9 % IV BOLUS (SEPSIS): 500.0000 mL | INTRAVENOUS | Status: AC

## 2013-08-02 MED ORDER — KETOROLAC TROMETHAMINE 30 MG/ML IJ SOLN
INTRAMUSCULAR | Status: AC
  Filled 2013-08-02: qty 1

## 2013-08-02 SURGICAL SUPPLY — 21 items
BAG URINE DRAINAGE (UROLOGICAL SUPPLIES) ×2 IMPLANT
BAG URO CATCHER STRL LF (DRAPE) ×2 IMPLANT
BLADE SURG 15 STRL LF DISP TIS (BLADE) IMPLANT
BLADE SURG 15 STRL SS (BLADE)
CATH HEMA 3WAY 30CC 22FR COUDE (CATHETERS) ×2 IMPLANT
DRAPE CAMERA CLOSED 9X96 (DRAPES) ×2 IMPLANT
ELECT BUTTON HF 24-28F 2 30DE (ELECTRODE) IMPLANT
ELECT HF RESECT BIPO 24F 45 ND (CUTTING LOOP) IMPLANT
ELECT LOOP MED HF 24F 12D CBL (CLIP) ×2 IMPLANT
ELECT RESECT VAPORIZE 12D CBL (ELECTRODE) IMPLANT
GLOVE BIOGEL M STRL SZ7.5 (GLOVE) ×6 IMPLANT
GOWN STRL REUS W/TWL XL LVL3 (GOWN DISPOSABLE) ×4 IMPLANT
HOLDER FOLEY CATH W/STRAP (MISCELLANEOUS) ×2 IMPLANT
IV NS IRRIG 3000ML ARTHROMATIC (IV SOLUTION) IMPLANT
KIT ASPIRATION TUBING (SET/KITS/TRAYS/PACK) ×2 IMPLANT
MANIFOLD NEPTUNE II (INSTRUMENTS) ×2 IMPLANT
NS IRRIG 1000ML POUR BTL (IV SOLUTION) ×2 IMPLANT
PACK CYSTO (CUSTOM PROCEDURE TRAY) ×2 IMPLANT
SYR 30ML LL (SYRINGE) ×2 IMPLANT
SYRINGE IRR TOOMEY STRL 70CC (SYRINGE) ×2 IMPLANT
TUBING CONNECTING 10 (TUBING) ×2 IMPLANT

## 2013-08-02 NOTE — Progress Notes (Signed)
Dr. Gaynelle Arabian called to check on pt status.  Update given, including CT of bladder. No evidence of bladder or free air.  Orders received.  Pt resting quietly, BP 107/74 HR NSR 88-92, Sats 95 on RA.  Pt has had vomiting x1 and continues to have nausea but says is "isn't as bad as it was".  Will continue to monitor throughout the night.

## 2013-08-02 NOTE — H&P (Signed)
3 mo f/u   Active Problems Problems  1. Benign prostatic hyperplasia with urinary obstruction (600.01,599.69)   Assessed By: Carolan Clines (Urology); Last Assessed: 09 Jul 2013 2. Nephrolithiasis (592.0)   Assessed By: Deborra Medina; Last Assessed: 03 Feb 2013  History of Present Illness 73 YO male returns today for a 3 mo f/u with a hx of:     1) BPH with BOO: managed well w/ Rapaflo 8mg  daily. His BPH symptoms are urgency, frequency, intermittancy, weak flow, difficulty postponing and nocturia x 2 w/ IPSS = 9/7.  06/29/12 PSA 2.352 ( Dr. Virgina Jock)  06/25/11 PSA - 2.456 (Dr. Virgina Jock  11/02/09 PSA 2.45    hx of stones    2) Nephrolithiasis: ESWL 18 yrs ago. Father had hx of stones.   3). 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 IPSS=10 on Rapaflo.   Past Medical History Problems  1. History of hypertension (V12.59) 2. History of kidney stones (V13.01)  Surgical History Problems  1. History of Appendectomy 2. History of Laminectomy Lumbar  Current Meds 1. Aspirin 81 MG Oral Tablet;  Therapy: (Recorded:22Sep2010) to Recorded 2. Losartan Potassium 50 MG Oral Tablet;  Therapy: [id number] to Recorded 3. Rapaflo 8 MG Oral Capsule; TAKE ONE CAPSULE EVERY DAY WITH FOOD;  Therapy: 21Dec2012 to (Evaluate:07Feb2015)  Requested for: [id number]; Last  Rx:12Feb2014 Ordered  Allergies Medication  1. Hyoscyamine Sulfate TBDP  Family History Problems  1. Family history of Colon Cancer (V16.0) : Father 2. Family history of Family Health Status Number Of Children : Mother   1 son 3. Family history of Nephrolithiasis 4. Family history of Prostate Cancer (N82.95) : Father  Social History Problems  1. Alcohol Use 2. Caffeine Use 3. Family history of Death In The Family Father 4. Family history of Death In The Family Mother 29. Marital History - Currently Married 6. Tobacco Use (V15.82)  Review of Systems Genitourinary, constitutional,  skin, eye, otolaryngeal, hematologic/lymphatic, cardiovascular, pulmonary, endocrine, musculoskeletal, gastrointestinal, neurological and psychiatric system(s) were reviewed and pertinent findings if present are noted.  Genitourinary: weak urinary stream, incomplete emptying of bladder and bladder pain.  Gastrointestinal: constipation.    Vitals Vital Signs [Data Includes: Last 1 Day]  Recorded: [id number] 01:57PM  Blood Pressure: 178 / 100 Temperature: 98.4 F Heart Rate: 101  Physical Exam Constitutional: Well nourished and well developed . No acute distress.  ENT:. The ears and nose are normal in appearance.  Neck: The appearance of the neck is normal and no neck mass is present.  Pulmonary: No respiratory distress and normal respiratory rhythm and effort.  Abdomen: The abdomen is soft and nontender. No masses are palpated. No CVA tenderness. No hernias are palpable. No hepatosplenomegaly noted.  Rectal: Rectal exam demonstrates normal sphincter tone. Estimated prostate size is 4+. Normal rectal tone, no rectal masses, prostate is smooth, symmetric and non-tender. The perineum is normal on inspection.  Genitourinary: Examination of the penis demonstrates no discharge, no masses, no lesions and a normal meatus. The scrotum is without lesions. The right epididymis is palpably normal and non-tender. The left epididymis is palpably normal and non-tender. The right testis is non-tender and without masses. The left testis is non-tender and without masses.  Lymphatics: The femoral and inguinal nodes are not enlarged or tender.  Skin: Normal skin turgor, no visible rash and no visible skin lesions.  Neuro/Psych:. Mood and affect are appropriate.    Results/Data Selected Results  UA With REFLEX [id number] 01:41PM Carolan Clines  SPECIMEN  TYPE: CLEAN CATCH   Test Name Result Flag Reference  COLOR YELLOW  YELLOW  APPEARANCE CLEAR  CLEAR  SPECIFIC GRAVITY 1.025  1.005-1.030  pH 6.0  5.0-8.0   GLUCOSE NEG mg/dL  NEG  BILIRUBIN NEG  NEG  KETONE NEG mg/dL  NEG  BLOOD LARGE A NEG  PROTEIN NEG mg/dL  NEG  UROBILINOGEN 0.2 mg/dL  0.0-1.0  NITRITE NEG  NEG  LEUKOCYTE ESTERASE NEG  NEG  SQUAMOUS EPITHELIAL/HPF NONE SEEN  RARE  WBC NONE SEEN WBC/hpf  <3  RBC 11-20 RBC/hpf A <3  BACTERIA FEW A RARE  CRYSTALS NONE SEEN  NONE SEEN  CASTS NONE SEEN  NONE SEEN   Assessment Assessed  1. Benign prostatic hyperplasia with urinary obstruction (016.55,374.82)  73 yo male with BPH, post lumbar surgery urinary retention. He is now ready for his TURP.   Plan TURP.   Discussion/Summary cc: Dr. Virgina Jock     Amendment  Cancelled order for PSA. Patient had a copy of the labs in his pocket at checkout. PSA was done by PCP & was 2.6 on 06/29/13.1     1 Amended By: Hillary Bow; Jul 09 2013 3:01 PM EST  Signatures Electronically signed by : Carolan Clines, M.D.; Jul 09 2013  4:24PM EST

## 2013-08-02 NOTE — Transfer of Care (Signed)
Immediate Anesthesia Transfer of Care Note  Patient: Brent Hanna  Procedure(s) Performed: Procedure(s): TRANSURETHRAL RESECTION OF THE PROSTATE WITH GYRUS INSTRUMENTS (N/A)  Patient Location: PACU  Anesthesia Type:General  Level of Consciousness: awake, alert , oriented and patient cooperative  Airway & Oxygen Therapy: Patient Spontanous Breathing and Patient connected to face mask oxygen  Post-op Assessment: Report given to PACU RN and Post -op Vital signs reviewed and stable  Post vital signs: Reviewed and stable  Complications: No apparent anesthesia complications

## 2013-08-02 NOTE — Anesthesia Preprocedure Evaluation (Signed)
Anesthesia Evaluation  Patient identified by MRN, date of birth, ID band Patient awake    Reviewed: Allergy & Precautions, H&P , NPO status , Patient's Chart, lab work & pertinent test results  Airway Mallampati: II TM Distance: >3 FB Neck ROM: Full    Dental no notable dental hx.    Pulmonary neg pulmonary ROS, former smoker,  breath sounds clear to auscultation  Pulmonary exam normal       Cardiovascular hypertension, Pt. on medications Rhythm:Regular Rate:Normal     Neuro/Psych negative neurological ROS  negative psych ROS   GI/Hepatic Neg liver ROS, GERD-  Medicated,  Endo/Other  negative endocrine ROS  Renal/GU negative Renal ROS  negative genitourinary   Musculoskeletal negative musculoskeletal ROS (+)   Abdominal   Peds negative pediatric ROS (+)  Hematology negative hematology ROS (+)   Anesthesia Other Findings   Reproductive/Obstetrics negative OB ROS                           Anesthesia Physical Anesthesia Plan  ASA: II  Anesthesia Plan: General   Post-op Pain Management:    Induction: Intravenous  Airway Management Planned: LMA  Additional Equipment:   Intra-op Plan:   Post-operative Plan: Extubation in OR  Informed Consent: I have reviewed the patients History and Physical, chart, labs and discussed the procedure including the risks, benefits and alternatives for the proposed anesthesia with the patient or authorized representative who has indicated his/her understanding and acceptance.   Dental advisory given  Plan Discussed with: CRNA  Anesthesia Plan Comments:         Anesthesia Quick Evaluation

## 2013-08-02 NOTE — Op Note (Signed)
Pre-operative diagnosis :   Benign prostate hyperplasia, trilobar with elevated bladder neck  Postoperative diagnosis:  Same  Operation:  Cystourethroscopy, transurethral resection of prostate  Surgeon:  S. Gaynelle Arabian, MD  First assistant:  None  Anesthesia :general LMA  Preparation:  After appropriate preanesthesia, the patient was brought the operating room, placed on the operating table in the dorsal supine position where general LMA anesthesia was introduced. He was then replaced in the dorsal lithotomy position with pubis was prepped with Betadine solution draped usual fashion. Cystourethroscopy revealed trilobar BPH with elevated median lobe the prostate. The trigone was located in normal position, and ureteral orifices were normal positional trigone. Further documentation of BPH and the ureteral orifices were identified and accomplished. Trabeculation and some formation was identified. There was no evidence of diverticular formation. There was no bladder stone or tumor identified. The prostate was easily friable, and bled easily.  The history was reviewed. The arm band was double checked. Review history:  Problems  1. Benign prostatic hyperplasia with urinary obstruction (600.01,599.69)   Assessed By: Carolan Clines (Urology); Last Assessed: 09 Jul 2013  2. Nephrolithiasis (592.0)   Assessed By: Deborra Medina; Last Assessed: 03 Feb 2013  History of Present Illness  73 YO male returns today for a 3 mo f/u with a hx of:  1) BPH with BOO: managed well w/ Rapaflo 8mg  daily. His BPH symptoms are urgency, frequency, intermittancy, weak flow, difficulty postponing and nocturia x 2 w/ IPSS = 9/7.  06/29/12 PSA 2.352 ( Dr. Virgina Jock)  06/25/11 PSA - 2.456 (Dr. Virgina Jock  11/02/09 PSA 2.45    Statement of  Likelihood of Success: Excellent. TIME-OUT observed.:  Procedure:  Resection was accomplished from the 11:00 to the 7:00 position, from the 7 to the 5:00 position, and from the 1:00 to the  5:00 position. Tissue was evacuated with the piston syringe, and sent to laboratory after a specimen timeout was obtained. Following this, the bladder was irrigated until all the chips were free from the bladder, and there was minimal bleeding. A 22 hematuria catheter was placed with continuous irrigation, and traction. The patient was awakened, and taken to recovery room in good condition. The tissue was sent to laboratory for examination.

## 2013-08-02 NOTE — Consult Note (Addendum)
PULMONARY / CRITICAL CARE MEDICINE   Name: Brent Hanna MRN: [id number] DOB: [birth year]    ADMISSION DATE:  08/02/2013 CONSULTATION DATE:  08/02/2013  REFERRING MD :  Gaynelle Arabian PRIMARY SERVICE: Urology  CHIEF COMPLAINT:  Syncope  BRIEF PATIENT DESCRIPTION: 73 y/o male with no prior cardiac history had a one minute period of syncope and apnea while receiving bladder irrigation on a med-surgery unit at [facility] on 08/02/2012 around 1750.  SIGNIFICANT EVENTS / STUDIES:  3/9 Cystourethroscopy, TURP  LINES / TUBES:   CULTURES:   ANTIBIOTICS: 3/9 Cipro >  HISTORY OF PRESENT ILLNESS:  73 y/o male with no prior cardiac history had a one minute period of syncope and apnea while receiving bladder irrigation on a med-surg unit at [facility] on 08/02/2012 around 1750.  He had his surgery in the morning on 3/9 and the procedure went well without complication. He was receiving continuous and intermittent bladder irrigation throughout the day due to continued blood clots noted in his foley.  While receiving an intermittent irrigation he had a syncopal spell during which time he was apnic. There was no telemetry available.  He never received CPR and regained consciousness spontaneously.  I arrived shortly after this and he denied chest pain, dyspnea, or headache.  He did have some pain in his suprapubic area.  PAST MEDICAL HISTORY :  Past Medical History  Diagnosis Date  . Hypertension   . Pain     BACK PAIN - HAD BACK SURG OCT 2014 - STILL A LOT OF PAIN  . History of kidney stones   . GERD (gastroesophageal reflux disease)     occasionally   . Arthritis   . BPH (benign prostatic hyperplasia)    Past Surgical History  Procedure Laterality Date  . Tonsillectomy    . Appendectomy    . Neck surgery Left 1978    piece of metal removed  . Colonoscopy w/ biopsies and polypectomy  2013  . Laminectomy with posterior lateral arthrodesis level 1 Left 03/24/2013    Procedure: LEFT LUMBAR FIVE-SACRAL ONE  LUMBAR LAMINECTOMY,FACETECTOMY,FORAMINOTOMY AND POSSIBLE MICRODISKECTOMY,POSTERIOR LATERAL ARTHRODESIS;  Surgeon: Hosie Spangle, MD;  Location: [facility];  Service: Neurosurgery;  Laterality: Left;  left   Prior to Admission medications   Medication Sig Start Date End Date Taking? Authorizing Provider  losartan (COZAAR) 50 MG tablet Take 25 mg by mouth every other day.    Yes Historical Provider, MD  omeprazole (PRILOSEC OTC) 20 MG tablet Take 20 mg by mouth daily as needed.   Yes Historical Provider, MD  silodosin (RAPAFLO) 8 MG CAPS capsule Take 8 mg by mouth daily with breakfast.   Yes Historical Provider, MD  aspirin EC 81 MG tablet Take 81 mg by mouth daily.    Historical Provider, MD  ibuprofen (ADVIL,MOTRIN) 200 MG tablet Take 400 mg by mouth every 6 (six) hours as needed for pain.    Historical Provider, MD   No Known Allergies  FAMILY HISTORY:  History reviewed. No pertinent family history. SOCIAL HISTORY:  reports that he has quit smoking. His smoking use included Cigarettes. He has a 10 pack-year smoking history. He has never used smokeless tobacco. He reports that he drinks alcohol. He reports that he does not use illicit drugs.  REVIEW OF SYSTEMS:   Gen: Denies fever, chills, weight change, fatigue, night sweats HEENT: Denies blurred vision, double vision, hearing loss, tinnitus, sinus congestion, rhinorrhea, sore throat, neck stiffness, dysphagia PULM: Denies shortness of breath, cough, sputum production,  hemoptysis, wheezing CV: Denies chest pain, edema, orthopnea, paroxysmal nocturnal dyspnea, palpitations GI: Denies abdominal pain, nausea, vomiting, diarrhea, hematochezia, melena, constipation, change in bowel habits GU: per HPI Endocrine: Denies hot or cold intolerance, polyuria, polyphagia or appetite change Derm: Denies rash, dry skin, scaling or peeling skin change Heme: Denies easy bruising, bleeding, bleeding gums Neuro: Denies headache, numbness, weakness,  slurred speech, loss of memory or consciousness   SUBJECTIVE:   VITAL SIGNS: Temp:  [97.8 F (36.6 C)-98.5 F (36.9 C)] 98.5 F (36.9 C) (03/09 1512) Pulse Rate:  [92-114] 114 (03/09 1512) Resp:  [9-18] 16 (03/09 1318) BP: (124-162)/(64-91) 128/69 mmHg (03/09 1512) SpO2:  [93 %-100 %] 99 % (03/09 1512) FiO2 (%):  [2 %] 2 % (03/09 1318) Weight:  [84.369 kg (186 lb)] 84.369 kg (186 lb) (03/09 1318) HEMODYNAMICS:   VENTILATOR SETTINGS: Vent Mode:  [-]  FiO2 (%):  [2 %] 2 % INTAKE / OUTPUT: Intake/Output     03/08 0701 - 03/09 0700 03/09 0701 - 03/10 0700   P.O.  880   I.V. (mL/kg)  2700 (32)   Other  16000   Total Intake(mL/kg)  19580 (232.1)   Urine (mL/kg/hr)  23275   Total Output   23275   Net   -3695          PHYSICAL EXAMINATION: Gen: anxious, no acute distress HEENT: NCAT, PERRL, EOMi, OP clear, neck supple without masses; neck scar left neck PULM: CTA B CV: RRR, no mgr, no JVD AB: BS+, mildly tender and firm suprapubic area, remainder of abdominal exam is normal Ext: warm, no edema, no clubbing, no cyanosis Derm: no rash or skin breakdown Neuro: A&Ox4, CN II-XII intact, MAEW   LABS:  CBC  Recent Labs Lab 07/28/13 0830 08/02/13 1746  WBC 4.4 10.3  HGB 15.6 13.2  HCT 45.5 38.2*  PLT 213 241   Coag's No results found for this basename: APTT, INR,  in the last 168 hours BMET  Recent Labs Lab 07/28/13 0830  NA 142  K 4.8  CL 104  CO2 26  BUN 17  CREATININE 0.91  GLUCOSE 106*   Electrolytes  Recent Labs Lab 07/28/13 0830  CALCIUM 9.3   Sepsis Markers No results found for this basename: LATICACIDVEN, PROCALCITON, O2SATVEN,  in the last 168 hours ABG  Recent Labs Lab 08/02/13 1758  PHART 7.343*  PCO2ART 39.4  PO2ART 136.0*   Liver Enzymes No results found for this basename: AST, ALT, ALKPHOS, BILITOT, ALBUMIN,  in the last 168 hours Cardiac Enzymes No results found for this basename: TROPONINI, PROBNP,  in the last 168  hours Glucose  Recent Labs Lab 08/02/13 1740  GLUCAP 219*    Imaging No results found.   CXR:   ASSESSMENT / PLAN:  PULMONARY A: No acute issues P:   -O2 as needed  CARDIOVASCULAR A: Syncope with hypotension> appears to be most consistent with a vasovagal episode due to bladder irrigation as his BP is improving now; ddx includes bleeding vs less likely cardiac event (EKG normal) vs undiagnosed valve or structural heart disease Lactic acidosis> likely due to hypotension P:  -tele -continue IVF with NS -bolus saline now -repeat lactic acid -f/u troponin -carotid doppler ultrasound -echo  RENAL A:  No acute issues P:   -f/u BMET  GASTROINTESTINAL A:  No acute issues P:   -advance diet if BP stable after 4 hours observation in ICU  HEMATOLOGIC A:  Bladder bleeding post op P:  -send CBC  now and in AM  INFECTIOUS A:  No acute issues P:   -cipro per urology  ENDOCRINE A:  No acute issues P:   -monitor glucose  NEUROLOGIC A:  Syncope due to loss of BP, does not appear to be a primary neurologic cause P:   -frequent neuro exams   I have personally obtained a history, examined the patient, evaluated laboratory and imaging results, formulated the assessment and plan and placed orders. CRITICAL CARE: The patient is critically ill with multiple organ systems failure and requires high complexity decision making for assessment and support, frequent evaluation and titration of therapies, application of advanced monitoring technologies and extensive interpretation of multiple databases. Critical Care Time devoted to patient care services described in this note is  45 minutes.   Jillyn Hidden PCCM Pager: [phone] Cell: [phone] If no response, call [phone]  08/02/2013, 6:24 PM

## 2013-08-02 NOTE — Progress Notes (Signed)
Pt complained of fullness in his abdomen.  Foley had been clotting off despite his CBI running at full speed, easily relieved with hand irrigation.  CBI suspended and Hand irrigated with NS x2.  Multiple clots and [name] red urine retrieved.  Foley draining again and CBI reinitiated.  At his point, patient had been talking to me and then started to have a fixed stare.  Called out his name and he responded but immediately started to stare off again.  After responding several times, he became pale and stopped responding.  Pt became apneic and weak thready pulse, Pressure 55/25.  Code Blue called.   Andre Lefort

## 2013-08-02 NOTE — Anesthesia Postprocedure Evaluation (Signed)
  Anesthesia Post-op Note  Patient: [name]  Procedure(s) Performed: Procedure(s) (LRB): TRANSURETHRAL RESECTION OF THE PROSTATE WITH GYRUS INSTRUMENTS (N/A)  Patient Location: PACU  Anesthesia Type: General  Level of Consciousness: awake and alert   Airway and Oxygen Therapy: Patient Spontanous Breathing  Post-op Pain: mild  Post-op Assessment: Post-op Vital signs reviewed, Patient's Cardiovascular Status Stable, Respiratory Function Stable, Patent Airway and No signs of Nausea or vomiting  Last Vitals:  Filed Vitals:   08/02/13 1245  BP: 132/73  Pulse: 101  Temp:   Resp: 14    Post-op Vital Signs: stable   Complications: No apparent anesthesia complications

## 2013-08-02 NOTE — Preoperative (Signed)
Beta Blockers   Reason not to administer Beta Blockers:Not Applicable 

## 2013-08-02 NOTE — Progress Notes (Signed)
eLink Physician-Brief Progress Note Patient Name: Brent Hanna DOB: Apr 21, 1941 MRN: [id number]  Date of Service  08/02/2013   HPI/Events of Note   N  zofran     Intervention Category Minor Interventions: Routine modifications to care plan (e.g. PRN medications for pain, fever)  Raylene Miyamoto. 08/02/2013, 8:28 PM

## 2013-08-02 NOTE — Progress Notes (Signed)
Around 2010, pt vomited large amount of emesis, which he said was his supper.  With pt at bedside during episode. Airway protected. Elink notifed with orders received.

## 2013-08-02 NOTE — Progress Notes (Signed)
Urology Progress Note  Day of Surgery  Gross hematuria post TURP: irrigated. Pt had vagal episode. Recovered, moved to ICU.  Subjective:     No acute urologic events overnight. Ambulation:   positive Flatus:    negative Bowel movement  negative  Pain: some relief  Objective:  Blood pressure 128/69, pulse 114, temperature 98.5 F (36.9 C), temperature source Oral, resp. rate 16, height 5' 9.02" (1.753 m), weight 84.369 kg (186 lb), SpO2 99.00%.  Physical Exam:  General:  No acute distress, awake. Breathing easier. Somewhat obtunded.   Genitourinary:    Suprapubic area tender, slightly distended.  Foley:  3-way in good position.        Recent Labs     08/02/13  1746  HGB  13.2  WBC  10.3  PLT  241    Recent Labs     08/02/13  1746  NA  138  K  4.5  CL  101  CO2  21  BUN  16  CREATININE  0.88  CALCIUM  8.6  GFRNONAA  85*  GFRAA  >90     No results found for this basename: PT, INR, APTT,  in the last 72 hours   No components found with this basename: ABG,   Assessment/Plan:  Catheter not removed. Continue any current medications.  Pt needs CT cystogram tonight. I will order and talk to Radiology.

## 2013-08-02 NOTE — Interval H&P Note (Signed)
History and Physical Interval Note:  08/02/2013 9:30 AM  Brent Hanna  has presented today for surgery, with the diagnosis of Benign Prostatic Hyperplasia  The various methods of treatment have been discussed with the patient and family. After consideration of risks, benefits and other options for treatment, the patient has consented to  Procedure(s): TRANSURETHRAL RESECTION OF THE PROSTATE WITH GYRUS INSTRUMENTS (N/A) as a surgical intervention .  The patient's history has been reviewed, patient examined, no change in status, stable for surgery.  I have reviewed the patient's chart and labs.  Questions were answered to the patient's satisfaction.     Carolan Clines I

## 2013-08-03 ENCOUNTER — Encounter (HOSPITAL_COMMUNITY): Payer: Self-pay | Admitting: Urology

## 2013-08-03 ENCOUNTER — Ambulatory Visit (HOSPITAL_COMMUNITY): Payer: Medicare Other

## 2013-08-03 DIAGNOSIS — R55 Syncope and collapse: Secondary | ICD-10-CM | POA: Diagnosis not present

## 2013-08-03 DIAGNOSIS — I517 Cardiomegaly: Secondary | ICD-10-CM

## 2013-08-03 DIAGNOSIS — R918 Other nonspecific abnormal finding of lung field: Secondary | ICD-10-CM | POA: Diagnosis not present

## 2013-08-03 LAB — BASIC METABOLIC PANEL
BUN: 18 mg/dL (ref 6–23)
CO2: 23 mEq/L (ref 19–32)
Calcium: 7.6 mg/dL — ABNORMAL LOW (ref 8.4–10.5)
Chloride: 106 mEq/L (ref 96–112)
Creatinine, Ser: 0.8 mg/dL (ref 0.50–1.35)
GFR calc Af Amer: >90 mL/min (ref >90)
GFR calc non Af Amer: 88 mL/min — ABNORMAL LOW (ref >90)
Glucose, Bld: 143 mg/dL — ABNORMAL HIGH (ref 70–99)
Potassium: 4.4 mEq/L (ref 3.7–5.3)
Sodium: 140 mEq/L (ref 137–147)

## 2013-08-03 LAB — CBC
HCT: 29.6 % — ABNORMAL LOW (ref 39.0–52.0)
Hemoglobin: 10.3 g/dL — ABNORMAL LOW (ref 13.0–17.0)
MCH: 31.5 pg (ref 26.0–34.0)
MCHC: 34.8 g/dL (ref 30.0–36.0)
MCV: 90.5 fL (ref 78.0–100.0)
Platelets: 198 10*3/uL (ref 150–400)
RBC: 3.27 MIL/uL — ABNORMAL LOW (ref 4.22–5.81)
RDW: 13.4 % (ref 11.5–15.5)
WBC: 10.1 10*3/uL (ref 4.0–10.5)

## 2013-08-03 MED ORDER — KETOROLAC TROMETHAMINE 15 MG/ML IJ SOLN
15.0000 mg | Freq: Once | INTRAMUSCULAR | Status: AC
  Administered 2013-08-03: 15 mg via INTRAVENOUS
  Filled 2013-08-03: qty 1

## 2013-08-03 MED ORDER — FENTANYL CITRATE 0.05 MG/ML IJ SOLN
25.0000 ug | INTRAMUSCULAR | Status: DC | PRN
  Administered 2013-08-03: 50 ug via INTRAVENOUS
  Filled 2013-08-03: qty 2

## 2013-08-03 MED ORDER — KETOROLAC TROMETHAMINE 30 MG/ML IJ SOLN
30.0000 mg | Freq: Four times a day (QID) | INTRAMUSCULAR | Status: AC
  Administered 2013-08-03 – 2013-08-04 (×4): 30 mg via INTRAVENOUS
  Filled 2013-08-03 (×4): qty 1

## 2013-08-03 MED ORDER — ACETAMINOPHEN 325 MG PO TABS: 650.0000 mg | ORAL_TABLET | Freq: Four times a day (QID) | ORAL | Status: DC | PRN

## 2013-08-03 NOTE — Progress Notes (Signed)
Echocardiogram 2D Echocardiogram has been performed.  Brent Hanna 08/03/2013, 11:50 AM

## 2013-08-03 NOTE — Progress Notes (Signed)
Pt continued to have vomiting and frequent nausea with dry heaves.  Has been receiving Dilaudid for pain.   Elink notified with orders given to change analgesia.  Will continue to evaluate pt.

## 2013-08-03 NOTE — Progress Notes (Signed)
eLink Physician-Brief Progress Note Patient Name: Brent Hanna DOB: 22-Jul-1940 MRN: [id number]  Date of Service  08/03/2013   HPI/Events of Note  Patient remains in significant pain but has nausea associated with dilaudid.  Patient is HD stable with stable renal function.   eICU Interventions  Plan: D/C dilaudid Fentanyl 25 to 75 mcg IV q2 hours prn pain One time dose of toradol 15 mg IV   Intervention Category Intermediate Interventions: Pain - evaluation and management  Blaire Hodsdon 08/03/2013, 2:02 AM

## 2013-08-03 NOTE — Progress Notes (Signed)
*  PRELIMINARY RESULTS* Vascular Ultrasound Carotid Duplex (Doppler) has been completed.  Preliminary findings: Bilateral:  1-39% ICA stenosis.  Vertebral artery flow is antegrade.     Landry Mellow, RDMS, RVT  08/03/2013, 11:30 AM

## 2013-08-03 NOTE — Progress Notes (Signed)
Urology Progress Note  1 Day Post-Op  Syncope yesterrday with bladder irrigation. ICU evaluatin negative so far. Carotid evaluation this AM for completeness.    Urine now clearing. CT last night negative for bladder rupture.  Bladder: CBI and hand irrigation overnight: urine pink. No clots.   Subjective:     No acute urologic events overnight. Ambulation:   negative. Wll sit in chait this AM Flatus:    positive Bowel movement  negative  Pain: some relief  Objective:  Blood pressure 123/71, pulse 104, temperature 98.2 F (36.8 C), temperature source Oral, resp. rate 62, height 5' 9.02" (1.753 m), weight 84.369 kg (186 lb), SpO2 100.00%.  Physical Exam:  General:  No acute distress, awake Extremities: extremities normal, atraumatic, no cyanosis or edema Genitourinary:  Soft pubis.  Foley:  Remains. Urine clear.     I/O last 3 completed shifts: In: 73746.3 [P.O.:880; I.V.:5626.3; GHWEX:93716] Out: 96789 [Urine:76300; Emesis/NG output:150]  Recent Labs     08/02/13  1746  08/03/13  0315  HGB  13.2  10.3*  WBC  10.3  10.1  PLT  241  198    Recent Labs     08/02/13  1746  08/03/13  0315  NA  138  140  K  4.5  4.4  CL  101  106  CO2  21  23  BUN  16  18  CREATININE  0.88  0.80  CALCIUM  8.6  7.6*  GFRNONAA  85*  88*  GFRAA  >90  >90     No results found for this basename: PT, INR, APTT,  in the last 72 hours   No components found with this basename: ABG,   Assessment/Plan: AM Hgb 13.2 and  Cr=0.89.    Urine clear this AM. Will turn down CBI prior to stopping it. If carotid evaluation is normal, then will transfer back to floor.

## 2013-08-03 NOTE — Progress Notes (Signed)
PULMONARY / CRITICAL CARE MEDICINE   Name: Brent Hanna MRN: [id number] DOB: 07/31/1940    ADMISSION DATE:  08/02/2013 CONSULTATION DATE:  08/02/2013  REFERRING MD :  Gaynelle Arabian PRIMARY CARE:  Shon Baton  CHIEF COMPLAINT:  Syncope  BRIEF PATIENT DESCRIPTION: 73 y/o male with no prior cardiac history had a one minute period of syncope and apnea while receiving bladder irrigation on a med-surgery unit at [facility] on 08/02/2012 around 1750.  SIGNIFICANT EVENTS:  3/9 Cystourethroscopy, TURP  STUDIES: 3/09 CT abd/pelvis >> large clot at base of bladder, 4.5 mm stone lower pole Lt kidney 3/10 Echo >>   LINES / TUBES: PIV  CULTURES: None  ANTIBIOTICS: 3/9 Cipro >>  SUBJECTIVE:  Denies dizziness, headache, difficulty with speech, blurred vision, chest pain, palpitations, dyspnea, or abdominal pain.  VITAL SIGNS: Temp:  [97.4 F (36.3 C)-98.5 F (36.9 C)] 98.2 F (36.8 C) (03/10 0500) Pulse Rate:  [84-149] 96 (03/10 0700) Resp:  [0-59] 31 (03/10 0700) BP: (97-149)/(34-82) 117/61 mmHg (03/10 0700) SpO2:  [90 %-100 %] 96 % (03/10 0700) FiO2 (%):  [2 %] 2 % (03/09 1318) Weight:  [186 lb (84.369 kg)] 186 lb (84.369 kg) (03/09 1318)  INTAKE / OUTPUT: Intake/Output     03/09 0701 - 03/10 0700 03/10 0701 - 03/11 0700   P.O. 880    I.V. (mL/kg) 5626.3 (66.7)    Other 67240 3000   Total Intake(mL/kg) 73746.3 (874.1) 3000 (35.6)   Urine (mL/kg/hr) 76300 4100 (44.8)   Emesis/NG output 150    Total Output 76450 4100   Net -2703.8 -1100        Emesis Occurrence 3 x      PHYSICAL EXAMINATION: Gen: no distress HEENT: Pupils reactive PULM: no wheeze/rales CV: regular, no murmur AB: soft, non tender, + bowel sounds Ext: no edema Derm: no rash or skin breakdown Neuro: normal strength, moves all extremities   LABS:  CBC  Recent Labs Lab 07/28/13 0830 08/02/13 1746 08/03/13 0315  WBC 4.4 10.3 10.1  HGB 15.6 13.2 10.3*  HCT 45.5 38.2* 29.6*  PLT 213 241 198    BMET  Recent Labs Lab 07/28/13 0830 08/02/13 1746 08/03/13 0315  NA 142 138 140  K 4.8 4.5 4.4  CL 104 101 106  CO2 26 21 23   BUN 17 16 18   CREATININE 0.91 0.88 0.80  GLUCOSE 106* 249* 143*   Electrolytes  Recent Labs Lab 07/28/13 0830 08/02/13 1746 08/03/13 0315  CALCIUM 9.3 8.6 7.6*   Sepsis Markers  Recent Labs Lab 08/02/13 1747 08/02/13 2145  LATICACIDVEN 4.5* 4.3*   ABG  Recent Labs Lab 08/02/13 1758  PHART 7.343*  PCO2ART 39.4  PO2ART 136.0*   Cardiac Enzymes  Recent Labs Lab 08/02/13 2145  TROPONINI <0.30   Glucose  Recent Labs Lab 08/02/13 1740  GLUCAP 219*    Imaging Ct Abdomen Pelvis W Contrast  08/02/2013   CLINICAL DATA:  Abdominal pain after cystourethroscopy and transurethral resection of the prostate gland.  EXAM: CT ABDOMEN AND PELVIS WITH CONTRAST  TECHNIQUE: Multidetector CT imaging of the abdomen and pelvis was performed using the standard protocol following bolus administration of intravenous contrast.  CONTRAST:  175mL OMNIPAQUE IOHEXOL 300 MG/ML  SOLN  COMPARISON:  CT scan dated 02/03/2013  FINDINGS: There is no evidence of bladder rupture. There is a large clot at the bladder base. Foley catheter is in place. There is a tiny amount of reflux into the distal left ureter. Delayed imaging demonstrates no  hydronephrosis or extravasation from the renal collecting systems. There is a 4.5 mm stone in the lower pole left kidney, stable.  The liver, biliary tree, spleen, pancreas, adrenal glands, and kidneys are otherwise normal. No dilated loops of large or small bowel. Moderate atheromatous irregularity in the left common iliac artery.  No acute osseous abnormalities.  IMPRESSION: Large clot in the bladder. No evidence of bladder rupture or other extravasation from the renal collecting systems. No free air or free fluid.   Electronically Signed   By: Rozetta Nunnery M.D.   On: 08/02/2013 19:55   Dg Chest Port 1 View  08/02/2013   CLINICAL  DATA:  Acute syncope.  Near arrest.  Weakness.  EXAM: PORTABLE CHEST - 1 VIEW  COMPARISON:  03/18/2013  FINDINGS: Shallow inflation. Heart size is upper limits normal accounting for the AP portable position of the patient. There are no focal consolidations or pleural effusions. The with of the superior mediastinum is prominent, likely accentuated by the portable technique. Followup PA and lateral chest x-ray is suggested when the patient is able. No pulmonary edema. Visualized osseous structures have a normal appearance.  IMPRESSION: 1. Accentuated superior mediastinal contour, possibly positional. Followup PA and lateral chest x-ray is recommended to exclude adenopathy. 2. Lungs are clear.   Electronically Signed   By: Shon Hale M.D.   On: 08/02/2013 19:19    ASSESSMENT / PLAN:  A: Syncope >> evaluation negative so far >> likely vasovagal event. Sinus tachycardia >> likely from bleeding, hypovolemia. Hx of HTN. P: Monitor on telemetry F/u Echo, carotid doppler Even fluid balance >> continue NS at 125 ml/hr Hold outpt cozaar until hemodynamics more stable Hold outpt ASA for now in setting of bleeding from bladder  A: Lactic acidosis in setting of hypotension. P: F/u lactic acid level  A: ?widened mediastinum on CXR >> most likely positional. P: F/u CXR  A: BPH s/p TURP 2/20 complicated by bleeding from bladder. Nephrolithiasis. P: Bladder irrigation, post op care per urology Cipro day 2 per urology  A: Post-operative anemia. P: F/u CBC SCD's for DVT prevention  A: Hx of GERD. P: PRN protonix  Updated family at bedside.  Will change to SDU status.   Chesley Mires, MD [facility] Pulmonary/Critical Care 08/03/2013, 8:36 AM Pager:  [phone] After 3pm call: [phone]

## 2013-08-04 DIAGNOSIS — R310 Gross hematuria: Secondary | ICD-10-CM | POA: Diagnosis present

## 2013-08-04 LAB — CBC
HCT: 23.5 % — ABNORMAL LOW (ref 39.0–52.0)
Hemoglobin: 7.9 g/dL — ABNORMAL LOW (ref 13.0–17.0)
MCH: 31 pg (ref 26.0–34.0)
MCHC: 33.6 g/dL (ref 30.0–36.0)
MCV: 92.2 fL (ref 78.0–100.0)
Platelets: 145 10*3/uL — ABNORMAL LOW (ref 150–400)
RBC: 2.55 MIL/uL — ABNORMAL LOW (ref 4.22–5.81)
RDW: 14.1 % (ref 11.5–15.5)
WBC: 7.3 10*3/uL (ref 4.0–10.5)

## 2013-08-04 LAB — ABO/RH: ABO/RH(D): O POS

## 2013-08-04 LAB — LACTIC ACID, PLASMA: Lactic Acid, Venous: 0.8 mmol/L (ref 0.5–2.2)

## 2013-08-04 LAB — BASIC METABOLIC PANEL
BUN: 21 mg/dL (ref 6–23)
CO2: 26 mEq/L (ref 19–32)
Calcium: 7.4 mg/dL — ABNORMAL LOW (ref 8.4–10.5)
Chloride: 107 mEq/L (ref 96–112)
Creatinine, Ser: 0.96 mg/dL (ref 0.50–1.35)
GFR calc Af Amer: >90 mL/min (ref >90)
GFR calc non Af Amer: 82 mL/min — ABNORMAL LOW (ref >90)
Glucose, Bld: 101 mg/dL — ABNORMAL HIGH (ref 70–99)
Potassium: 3.7 mEq/L (ref 3.7–5.3)
Sodium: 141 mEq/L (ref 137–147)

## 2013-08-04 LAB — HEPATIC FUNCTION PANEL
ALT: 10 U/L (ref 0–53)
AST: 15 U/L (ref 0–37)
Albumin: 2.5 g/dL — ABNORMAL LOW (ref 3.5–5.2)
Alkaline Phosphatase: 42 U/L (ref 39–117)
Bilirubin, Direct: <0.2 mg/dL (ref 0.0–0.3)
Total Bilirubin: 0.6 mg/dL (ref 0.3–1.2)
Total Protein: 4.4 g/dL — ABNORMAL LOW (ref 6.0–8.3)

## 2013-08-04 LAB — PREPARE RBC (CROSSMATCH)

## 2013-08-04 MED ORDER — LOSARTAN POTASSIUM 25 MG PO TABS
25.0000 mg | ORAL_TABLET | ORAL | Status: DC
  Filled 2013-08-04: qty 1

## 2013-08-04 NOTE — Care Management Note (Addendum)
    Page 1 of 2   08/05/2013     3:38:53 PM   CARE MANAGEMENT NOTE 08/05/2013  Patient:  Brent Hanna, Brent Hanna   Account Number:  [id number]  Date Initiated:  08/04/2013  Documentation initiated by:  DAVIS,RHONDA  Subjective/Objective Assessment:   pt started out as outpt in a bed and then required higher level of care and was converted to inpatient/hematuria has continued and required bld transfusions.     Action/Plan:   tbd-   Anticipated DC Date:  08/05/2013   Anticipated DC Plan:  HOME/SELF CARE  In-house referral  NA      DC Planning Services  CM consult      PAC Choice  NA   Choice offered to / List presented to:  NA   DME arranged  NA      DME agency  NA     [place] arranged  NA      [place] agency  NA   Status of service:  Completed, signed off Medicare Important Message given?  NA - LOS <3 / Initial given by admissions (If response is "NO", the following Medicare IM given date fields will be blank) Date Medicare IM given:   Date Additional Medicare IM given:    Discharge Disposition:  HOME/SELF CARE  Per UR Regulation:  Reviewed for med. necessity/level of care/duration of stay  If discussed at [place] of Stay Meetings, dates discussed:    Comments:  08/05/13 Bonnie Roig RN,BSN NCM [phone] D/C HOME NO [place].  08/04/13 Tajuana Kniskern RN,BSN NCM [phone] TRANSFER FROM SDU.POD#2 TURP,HEMATURIA,S/P 2U PRBC.D/C PLAN HOME.  [id number]/Rhonda Rosana Hoes, RN, BSN, [place] [phone] Chart Reviewed for discharge and hospital needs. Discharge needs at time of review:  None present will follow for needs. Review of patient progress due on [id number].

## 2013-08-04 NOTE — Progress Notes (Signed)
Urology Progress Note  2 Days Post-Op  Hgb drop to 7.9. Pt feels weak, "washed out". BP elevated ( BP meds held x 2 days).  Subjective:     No acute urologic events overnight. Ambulation:   negative Flatus:    positive Bowel movement  positive  Pain: some relief. Scrotal skin sensitivity.   Objective:  Blood pressure 166/75, pulse 94, temperature 98.1 F (36.7 C), temperature source Oral, resp. rate 20, height 5' 9.02" (1.753 m), weight 86.7 kg (191 lb 2.2 oz), SpO2 94.00%.  Physical Exam:  General:  No acute distress, awake Extremities: extremities normal, atraumatic, no cyanosis or edema Genitourinary:  Normal.  Foley:  Bloody: old.     I/O last 3 completed shifts: In: 08676.1 [P.O.:600; I.V.:4143.8; PJKDT:26712] Out: 45809 [Urine:63100; Emesis/NG output:150]  Recent Labs     08/03/13  0315  08/04/13  0328  HGB  10.3*  7.9*  WBC  10.1  7.3  PLT  198  145*    Recent Labs     08/03/13  0315  08/04/13  0328  NA  140  141  K  4.4  3.7  CL  106  107  CO2  23  26  BUN  18  21  CREATININE  0.80  0.96  CALCIUM  7.6*  7.4*  GFRNONAA  88*  82*  GFRAA  >90  >90     No results found for this basename: PT, INR, APTT,  in the last 72 hours   No components found with this basename: ABG,   Assessment/Plan:  Plan: ok to go to floor: will have 2 unit   transfusion today. Hematuria today is from old blood clot in his bladder now disintegrating.

## 2013-08-04 NOTE — Progress Notes (Signed)
PULMONARY / CRITICAL CARE MEDICINE   Name: Brent Hanna MRN: [id number] DOB: August 01, 1940    ADMISSION DATE:  08/02/2013 CONSULTATION DATE:  08/02/2013  REFERRING MD :  Gaynelle Arabian PRIMARY CARE:  Shon Baton  CHIEF COMPLAINT:  Syncope  BRIEF PATIENT DESCRIPTION: 73 y/o male with no prior cardiac history had a one minute period of syncope and apnea while receiving bladder irrigation on a med-surgery unit at [facility] on 08/02/2012 around 1750.  SIGNIFICANT EVENTS:  3/9 Cystourethroscopy, TURP  STUDIES: 3/09  CT abd/pelvis >> large clot at base of bladder, 4.5 mm stone lower pole Lt kidney 3/10  Echo >> mild LVH, EF 50-55%, nml wall motion, grade 1 diastolic dysfunction 2/77  Carotid Doppler >> antegrade flow, 39% stenosis of R ICA & L ICA 3/11  Tx of 1 unit PRBC's  LINES / TUBES: PIV  CULTURES: None  ANTIBIOTICS: 3/9 Cipro >>  SUBJECTIVE:  "feel pretty run down today"  VITAL SIGNS: Temp:  [98.1 F (36.7 C)-98.8 F (37.1 C)] 98.4 F (36.9 C) (03/11 0757) Pulse Rate:  [93-119] 101 (03/11 0800) Resp:  [14-65] 16 (03/11 0800) BP: (81-187)/(54-83) 187/83 mmHg (03/11 0800) SpO2:  [90 %-96 %] 95 % (03/11 0800) Weight:  [191 lb 2.2 oz (86.7 kg)] 191 lb 2.2 oz (86.7 kg) (03/11 0400)  INTAKE / OUTPUT: Intake/Output     03/10 0701 - 03/11 0700 03/11 0701 - 03/12 0700   P.O. 600    I.V. (mL/kg) 2875 (33.2) 250 (2.9)   Other 12000    Total Intake(mL/kg) 15475 (178.5) 250 (2.9)   Urine (mL/kg/hr) 12875 (6.2) 1150 (3.2)   Emesis/NG output     Total Output 12875 1150   Net +2600 -900          PHYSICAL EXAMINATION: Gen: no distress HEENT: Pupils reactive PULM: no wheeze/rales CV:  S1s2, ?s3 AB: soft, non tender, + bowel sounds Ext: no edema Derm: no rash or skin breakdown Neuro: normal strength, moves all extremities   LABS:  CBC  Recent Labs Lab 08/02/13 1746 08/03/13 0315 08/04/13 0328  WBC 10.3 10.1 7.3  HGB 13.2 10.3* 7.9*  HCT 38.2* 29.6* 23.5*  PLT 241 198  145*   BMET  Recent Labs Lab 08/02/13 1746 08/03/13 0315 08/04/13 0328  NA 138 140 141  K 4.5 4.4 3.7  CL 101 106 107  CO2 21 23 26   BUN 16 18 21   CREATININE 0.88 0.80 0.96  GLUCOSE 249* 143* 101*   Electrolytes  Recent Labs Lab 08/02/13 1746 08/03/13 0315 08/04/13 0328  CALCIUM 8.6 7.6* 7.4*   Sepsis Markers  Recent Labs Lab 08/02/13 1747 08/02/13 2145 08/04/13 0328  LATICACIDVEN 4.5* 4.3* 0.8   ABG  Recent Labs Lab 08/02/13 1758  PHART 7.343*  PCO2ART 39.4  PO2ART 136.0*   Cardiac Enzymes  Recent Labs Lab 08/02/13 2145  TROPONINI <0.30   Glucose  Recent Labs Lab 08/02/13 1740  GLUCAP 219*    Imaging Dg Chest 2 View  08/03/2013   CLINICAL DATA:  Followup widened mediastinum, noted on a recent portable chest radiograph.  EXAM: CHEST  2 VIEW  COMPARISON:  Portable chest radiograph 08/02/2013 and two-view chest radiograph 03/18/2013  FINDINGS: The heart size is normal. The mediastinal contours appear stable compared to a two-view chest radiograph dated 03/18/2013. Findings on recent portable chest radiograph are consistent with differences in patient positioning for portable technique.  On the lateral view, the patient's arms overlap of portion of the anterior chest. No airspace  disease, pleural effusion, or pneumothorax. The trachea is midline. No acute osseous abnormality.  IMPRESSION: No evidence of acute cardiopulmonary disease. The cardiomediastinal silhouette is within normal limits and stable compared to prior two-view chest radiograph of 07/19/2012.   Electronically Signed   By: Curlene Dolphin M.D.   On: 08/03/2013 10:13   Ct Abdomen Pelvis W Contrast  08/02/2013   CLINICAL DATA:  Abdominal pain after cystourethroscopy and transurethral resection of the prostate gland.  EXAM: CT ABDOMEN AND PELVIS WITH CONTRAST  TECHNIQUE: Multidetector CT imaging of the abdomen and pelvis was performed using the standard protocol following bolus administration of  intravenous contrast.  CONTRAST:  126mL OMNIPAQUE IOHEXOL 300 MG/ML  SOLN  COMPARISON:  CT scan dated 02/03/2013  FINDINGS: There is no evidence of bladder rupture. There is a large clot at the bladder base. Foley catheter is in place. There is a tiny amount of reflux into the distal left ureter. Delayed imaging demonstrates no hydronephrosis or extravasation from the renal collecting systems. There is a 4.5 mm stone in the lower pole left kidney, stable.  The liver, biliary tree, spleen, pancreas, adrenal glands, and kidneys are otherwise normal. No dilated loops of large or small bowel. Moderate atheromatous irregularity in the left common iliac artery.  No acute osseous abnormalities.  IMPRESSION: Large clot in the bladder. No evidence of bladder rupture or other extravasation from the renal collecting systems. No free air or free fluid.   Electronically Signed   By: Rozetta Nunnery M.D.   On: 08/02/2013 19:55   Dg Chest Port 1 View  08/02/2013   CLINICAL DATA:  Acute syncope.  Near arrest.  Weakness.  EXAM: PORTABLE CHEST - 1 VIEW  COMPARISON:  03/18/2013  FINDINGS: Shallow inflation. Heart size is upper limits normal accounting for the AP portable position of the patient. There are no focal consolidations or pleural effusions. The with of the superior mediastinum is prominent, likely accentuated by the portable technique. Followup PA and lateral chest x-ray is suggested when the patient is able. No pulmonary edema. Visualized osseous structures have a normal appearance.  IMPRESSION: 1. Accentuated superior mediastinal contour, possibly positional. Followup PA and lateral chest x-ray is recommended to exclude adenopathy. 2. Lungs are clear.   Electronically Signed   By: Shon Hale M.D.   On: 08/02/2013 19:19    ASSESSMENT / PLAN:  A: Syncope >> evaluation negative so far >> likely vasovagal event. Sinus tachycardia >> likely from bleeding, hypovolemia. Hx of HTN. 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 P: Cleared lactic acid 3/11  A: ?widened mediastinum on CXR >> most likely positional. P: F/u CXR 3/10 with normal cardiomediastinal silhouette  A: BPH s/p TURP [id number] complicated by bleeding from bladder. Nephrolithiasis. P: Bladder irrigation, post op care per urology Cipro day 3 per urology  A: Post-operative anemia. P: F/u CBC SCD's for DVT prevention Tx  1 unit PRBC's 3/11, post transfusion H/H  A: Hx of GERD. P: PRN protonix   PCCM will sign off.  Please call if further needs arise.   Noe Gens, NP-C [place] [facility] Pgr: [phone] or [phone]  Reviewed above, and examined pt.  Evaluation for alternative cause of syncope negative.  Likely cause vasovagal.  Agree with transfer to telemetry.  PCCM will sign off.  If additional medical assistance needed, then recommend consulted pt's PCP, Dr. Virgina Jock.  Chesley Mires, MD [facility] Pulmonary/Critical Care 08/04/2013, 11:46 AM Pager:  [phone] After 3pm call: [phone]

## 2013-08-04 NOTE — Progress Notes (Signed)
CARE MANAGEMENT NOTE 08/04/2013  Patient:  Brent Hanna, Brent Hanna   Account Number:  [id number]  Date Initiated:  08/04/2013  Documentation initiated by:  DAVIS,RHONDA  Subjective/Objective Assessment:   pt started out as outpt in a bed and then required higher level of care and was converted to inpatient/hematuria has continued and required bld transfusions.     Action/Plan:   tbd-   Anticipated DC Date:  08/07/2013   Anticipated DC Plan:  HOME/SELF CARE  In-house referral  NA      DC Planning Services  NA      PAC Choice  NA   Choice offered to / List presented to:  NA   DME arranged  NA      DME agency  NA     [place] arranged  NA      [place] agency  NA   Status of service:  In process, will continue to follow Medicare Important Message given?  NA - LOS <3 / Initial given by admissions (If response is "NO", the following Medicare IM given date fields will be blank) Date Medicare IM given:   Date Additional Medicare IM given:    Discharge Disposition:    Per UR Regulation:  Reviewed for med. necessity/level of care/duration of stay  If discussed at [place] of Stay Meetings, dates discussed:    Comments:  [id number]/Rhonda Eldridge Dace, [place], [place] [phone] Chart Reviewed for discharge and hospital needs. Discharge needs at time of review:  None present will follow for needs. Review of patient progress due on [id number].

## 2013-08-04 NOTE — Progress Notes (Signed)
Received patient from ICU. Agree with assessment and plan of care. Will continue to monitor.

## 2013-08-04 NOTE — Progress Notes (Signed)
Telephone call to [facility]/DR.Tanne[place]/concerning level of care pt is still listed as an outpatient in a bed/patient has received iv flds, bladder irrigations, cardiac monitoring, and blood transfusions. Please change status to inpatient.

## 2013-08-05 LAB — CBC
HCT: 33.4 % — ABNORMAL LOW (ref 39.0–52.0)
Hemoglobin: 11.2 g/dL — ABNORMAL LOW (ref 13.0–17.0)
MCH: 30.1 pg (ref 26.0–34.0)
MCHC: 33.5 g/dL (ref 30.0–36.0)
MCV: 89.8 fL (ref 78.0–100.0)
Platelets: 167 10*3/uL (ref 150–400)
RBC: 3.72 MIL/uL — ABNORMAL LOW (ref 4.22–5.81)
RDW: 14.7 % (ref 11.5–15.5)
WBC: 7.4 10*3/uL (ref 4.0–10.5)

## 2013-08-05 LAB — TYPE AND SCREEN
ABO/RH(D): O POS
Antibody Screen: NEGATIVE
Unit division: 0
Unit division: 0

## 2013-08-05 MED ORDER — OXYCODONE-ACETAMINOPHEN 5-325 MG PO TABS: 1.0000 | ORAL_TABLET | ORAL | Status: DC | PRN

## 2013-08-05 MED ORDER — CIPROFLOXACIN HCL 500 MG PO TABS: 500.0000 mg | ORAL_TABLET | Freq: Two times a day (BID) | ORAL | Status: DC

## 2013-08-05 NOTE — Discharge Instructions (Signed)
Benign Prostatic Hyperplasia An enlarged prostate (benign prostatic hyperplasia) is common in older men. You may experience the following:  Weak urine stream.  Dribbling.  Feeling like the bladder has not emptied completely.  Difficulty starting urination.  Getting up frequently at night to urinate.  Urinating more frequently during the day. HOME CARE INSTRUCTIONS  Monitor your prostatic hyperplasia for any changes. The following actions may help to alleviate any discomfort you are experiencing:  Give yourself time when you urinate.  Stay away from alcohol.  Avoid beverages containing caffeine, such as coffee, tea, and colas, because they can make the problem worse.  Avoid decongestants, antihistamines, and some prescription medicines that can make the problem worse.  Follow up with your health care provider for further treatment as recommended. SEEK MEDICAL CARE IF:  You are experiencing progressive difficulty voiding.  Your urine stream is progressively getting narrower.  You are awaking from sleep with the urge to void more frequently.  You are constantly feeling the need to void.  You experience loss of urine, especially in small amounts. SEEK IMMEDIATE MEDICAL CARE IF:   You develop increased pain with urination or are unable to urinate.  You develop severe abdominal pain, vomiting, a high fever, or fainting.  You develop back pain or blood in your urine. MAKE SURE YOU:   Understand these instructions.  Will watch your condition.  Will get help right away if you are not doing well or get worse. Document Released: 05/13/2005 Document Revised: 01/13/2013 Document Reviewed: 10/13/2012 [facility] Patient Information 2014 [place]. Post transurethral resection of the prostate (TURP) instructions  Your recent prostate surgery requires very special post hospital care. Despite the fact that no skin incisions were used the area around the prostate incision is  quite raw and is covered with a scab to promote healing and prevent bleeding. Certain cautions are needed to assure that the scab is not disturbed of the next 2-3 weeks while the healing proceeds.  Because the raw surface in your prostate and the irritating effects of urine you may expect frequency of urination and/or urgency (a stronger desire to urinate) and perhaps even getting up at night more often. This will usually resolve or improve slowly over the healing period. You may see some blood in your urine over the first 6 weeks. Do not be alarmed, even if the urine was clear for a while. Get off your feet and drink lots of fluids until clearing occurs. If you start to pass clots or don't improve call us.  Diet:  You may return to your normal diet immediately. Because of the raw surface of your bladder, alcohol, spicy foods, foods high in acid and drinks with caffeine may cause irritation or frequency and should be used in moderation. To keep your urine flowing freely and avoid constipation, drink plenty of fluids during the day (8-10 glasses). Tip: Avoid cranberry juice because it is very acidic.  Activity:  Your physical activity doesn't need to be restricted. However, if you are very active, you may see some blood in the urine. We suggest that you reduce your activity under the circumstances until the bleeding has stopped.  Bowels:  It is important to keep your bowels regular during the postoperative period. Straining with bowel movements can cause bleeding. A bowel movement every other day is reasonable. Use a mild laxative if needed, such as milk of magnesia 2-3 tablespoons, or 2 Dulcolax tablets. Call if you continue to have problems. If you had been  taking narcotics for pain, before, during or after your surgery, you may be constipated. Take a laxative if necessary.  Medication:  You should resume your pre-surgery medications unless told not to. In addition you may be given an antibiotic  to prevent or treat infection. Antibiotics are not always necessary. All medication should be taken as prescribed until the bottles are finished unless you are having an unusual reaction to one of the drugs.     Problems you should report to [place]:  a. Fever greater than 101F. b. Heavy bleeding, or clots (see notes above about blood in urine). c. Inability to urinate. d. Drug reactions (hives, rash, nausea, vomiting, diarrhea). e. Severe burning or pain with urination that is not improving.

## 2013-08-05 NOTE — Discharge Summary (Signed)
  Physician Discharge Summary  Patient ID: Brent Hanna MRN: [id number] DOB/AGE: 1941-05-02 73 y.o.  Admit date: 08/02/2013 Discharge date: 08/05/2013  Admission Diagnoses: Benign Prostatic Hyperplasia  Discharge Diagnoses:  Active Problems:   Benign prostate hyperplasia   Syncope   Hematuria, gross post op anemia  Discharged Condition: stable  Hospital Course:   TURP                                  Transfusion                                  ICU evaluation/observation  Consults: pulmonary/intensive care  Significant Diagnostic Studies: No results found.  Treatments: IV hydration  Discharge Exam: Blood pressure 156/78, pulse 95, temperature 98.3 F (36.8 C), temperature source Oral, resp. rate 20, height 5\' 9"  (1.753 m), weight 86.6 kg (190 lb 14.7 oz), SpO2 94.00%. General appearance: alert and cooperative Head: Normocephalic, without obvious abnormality, atraumatic  Disposition: 01-Home or Self Care. No cycling. OK to walk. Anticipate red urine for up to 3 weeks. Discussed passage of clots.       Percocet/cipro.  Discharge Orders   Future Orders Complete By Expires   Discharge patient  As directed    Discontinue IV  As directed    Foley catheter - discontinue  As directed    Irrigate foley  As directed    Scheduling Instructions:     irrigatre foley with 60-120cc NS prior to d/c foley       Medication List         aspirin EC 81 MG tablet  Take 81 mg by mouth daily.     ciprofloxacin 500 MG tablet  Commonly known as:  CIPRO  Take 1 tablet (500 mg total) by mouth 2 (two) times daily.     ibuprofen 200 MG tablet  Commonly known as:  ADVIL,MOTRIN  Take 400 mg by mouth every 6 (six) hours as needed for pain.     losartan 50 MG tablet  Commonly known as:  COZAAR  Take 25 mg by mouth every other day.     omeprazole 20 MG tablet  Commonly known as:  PRILOSEC OTC  Take 20 mg by mouth daily as needed.     oxyCODONE-acetaminophen 5-325 MG per tablet   Commonly known as:  ROXICET  Take 1 tablet by mouth every 4 (four) hours as needed for severe pain.     silodosin 8 MG Caps capsule  Commonly known as:  RAPAFLO  Take 8 mg by mouth daily with breakfast.           Follow-up Information   Follow up with Ailene Rud, MD.   Specialty:  Urology   Contact information:   [facility]  [place] [address] [phone]      D/c today.  SignedCarolan Clines I 08/05/2013, 9:58 AM

## 2013-08-05 NOTE — Clinical Documentation Improvement (Signed)
08/05/13  Dear Mickle Mallory Rolley Sims  Please clarify if possible Clinical Conditions?    Expected Acute Blood Loss Anemia  Acute Blood Loss Anemia  Acute on chronic blood loss anemia  Chronic blood loss anemia  Precipitous drop in Hematocrit  Other Condition________________  Cannot Clinically Determine  Risk Factors: recent surgery, pre op anemia  Supporting Information:  Signs and Symptoms: pn on 08/02/13: "Gross hematuria post TURP" Consult: "Bladder bleeding post op    //  Sinus tachycardia >> likely from bleeding, hypovolemia Post-operative anemia BPH s/p TURP 4/03 complicated by bleeding from bladder  Diagnostics: Component     Latest Ref Rng 08/02/2013 08/03/2013 08/04/2013  RBC     4.22 - 5.81 MIL/uL 4.19 (L) 3.27 (L) 2.55 (L)  Hemoglobin     13.0 - 17.0 g/dL 13.2 10.3 (L) 7.9 (L)  HCT     39.0 - 52.0 % 38.2 (L) 29.6 (L) 23.5 (L)   H&H after TX: Treatments:Bladder irrigation,  Monitor on telemetry, NS at 125 ml/hr, CBC  Hold outpt ASA for now in setting of bleeding from bladder  Transfusion: 08/04/13    1UPRBC IV fluids / plasma expanders: NS@125ml /hr Serial H&H monitoring   Thank Glendora Score ,RN Clinical Documentation Specialist:  [facility]

## 2013-08-05 NOTE — Progress Notes (Signed)
Urology Progress Note  3 Days Post-Op   Subjective: post TURP. Syncopal episode operative night.  Post operative bleeding, requiried 2 unit prbc. Pt feels much better this AM. Traction and CBI d/c's.  Urine pink today./ CT showed clot in bladder. No extravisation.      No acute urologic events overnight. Ambulation:   positive Flatus:    positive Bowel movement  positive  Pain: complete resolution  Objective:  Blood pressure 156/78, pulse 95, temperature 98.3 F (36.8 C), temperature source Oral, resp. rate 20, height 5\' 9"  (1.753 m), weight 86.6 kg (190 lb 14.7 oz), SpO2 94.00%.  Physical Exam:  General:  No acute distress, awake Extremities: extremities normal, atraumatic, no cyanosis or edema Genitourinary:  normnal Foley: ready for d/c    I/O last 3 completed shifts: In: 4260.4 [P.O.:300; I.V.:3391.7; Blood:568.8] Out: 6050 [Urine:6050]  Recent Labs     08/04/13  0328  08/05/13  0741  HGB  7.9*  11.2*  WBC  7.3  7.4  PLT  145*  167    Recent Labs     08/03/13  0315  08/04/13  0328  NA  140  141  K  4.4  3.7  CL  106  107  CO2  23  26  BUN  18  21  CREATININE  0.80  0.96  CALCIUM  7.6*  7.4*  GFRNONAA  88*  82*  GFRAA  >90  >90     No results found for this basename: PT, INR, APTT,  in the last 72 hours   No components found with this basename: ABG,   Assessment/Plan:  Pt is to increase activities as tolerated. Ok to walk, bit no cycling. No golf yet. Percocet/cipro irrigater and d/c foley.

## 2013-08-06 DIAGNOSIS — I10 Essential (primary) hypertension: Secondary | ICD-10-CM | POA: Diagnosis not present

## 2013-08-06 DIAGNOSIS — M545 Low back pain, unspecified: Secondary | ICD-10-CM | POA: Diagnosis not present

## 2013-08-06 DIAGNOSIS — Z6826 Body mass index (BMI) 26.0-26.9, adult: Secondary | ICD-10-CM | POA: Diagnosis not present

## 2013-08-06 DIAGNOSIS — M5137 Other intervertebral disc degeneration, lumbosacral region: Secondary | ICD-10-CM | POA: Diagnosis not present

## 2013-08-06 DIAGNOSIS — M5417 Radiculopathy, lumbosacral region: Secondary | ICD-10-CM | POA: Insufficient documentation

## 2013-08-07 ENCOUNTER — Emergency Department (HOSPITAL_COMMUNITY)
Admission: EM | Admit: 2013-08-07 | Discharge: 2013-08-08 | Disposition: A | Discharge status: Home / Self Care | Payer: Medicare Other | Attending: Emergency Medicine | Admitting: Emergency Medicine

## 2013-08-07 ENCOUNTER — Encounter (HOSPITAL_COMMUNITY): Payer: Self-pay | Admitting: Emergency Medicine

## 2013-08-07 DIAGNOSIS — Z7982 Long term (current) use of aspirin: Secondary | ICD-10-CM | POA: Insufficient documentation

## 2013-08-07 DIAGNOSIS — Z79899 Other long term (current) drug therapy: Secondary | ICD-10-CM | POA: Insufficient documentation

## 2013-08-07 DIAGNOSIS — K219 Gastro-esophageal reflux disease without esophagitis: Secondary | ICD-10-CM | POA: Diagnosis not present

## 2013-08-07 DIAGNOSIS — M129 Arthropathy, unspecified: Secondary | ICD-10-CM | POA: Insufficient documentation

## 2013-08-07 DIAGNOSIS — I10 Essential (primary) hypertension: Secondary | ICD-10-CM | POA: Insufficient documentation

## 2013-08-07 DIAGNOSIS — Z792 Long term (current) use of antibiotics: Secondary | ICD-10-CM | POA: Diagnosis not present

## 2013-08-07 DIAGNOSIS — Z87442 Personal history of urinary calculi: Secondary | ICD-10-CM | POA: Diagnosis not present

## 2013-08-07 DIAGNOSIS — N401 Enlarged prostate with lower urinary tract symptoms: Secondary | ICD-10-CM | POA: Diagnosis not present

## 2013-08-07 DIAGNOSIS — Z9889 Other specified postprocedural states: Secondary | ICD-10-CM | POA: Insufficient documentation

## 2013-08-07 DIAGNOSIS — R339 Retention of urine, unspecified: Secondary | ICD-10-CM

## 2013-08-07 DIAGNOSIS — R319 Hematuria, unspecified: Secondary | ICD-10-CM | POA: Insufficient documentation

## 2013-08-07 DIAGNOSIS — N138 Other obstructive and reflux uropathy: Secondary | ICD-10-CM | POA: Insufficient documentation

## 2013-08-07 DIAGNOSIS — Z87891 Personal history of nicotine dependence: Secondary | ICD-10-CM | POA: Diagnosis not present

## 2013-08-07 NOTE — ED Notes (Signed)
Procedure done Monday on prostate,  States unable to urinate for about 5 hours.  Pt states he is in pain,  And appears to be in discomfort  7/10

## 2013-08-07 NOTE — ED Notes (Signed)
Pt states he was told by urologist that he has a blood clot in his bladder, and was warned that it might lead to urinary retention. Pt states he has been passing small clots in urine, but hasn't been able to pee for the past 5 hours. Pt c/o pelvic pain.

## 2013-08-08 LAB — URINALYSIS, ROUTINE W REFLEX MICROSCOPIC
Bilirubin Urine: NEGATIVE
Glucose, UA: NEGATIVE mg/dL
Ketones, ur: 15 mg/dL — AB
Nitrite: NEGATIVE
Protein, ur: 100 mg/dL — AB
Specific Gravity, Urine: 1.013 (ref 1.005–1.030)
Urobilinogen, UA: 0.2 mg/dL (ref 0.0–1.0)
pH: 6 (ref 5.0–8.0)

## 2013-08-08 LAB — URINE MICROSCOPIC-ADD ON

## 2013-08-08 NOTE — ED Provider Notes (Signed)
CSN: [id number]     Arrival date & time 08/07/13  2322 History   First MD Initiated Contact with Patient 08/08/13 0000     Chief Complaint  Patient presents with  . Urinary Retention     (Consider location/radiation/quality/duration/timing/severity/associated sxs/prior Treatment) The history is provided by the patient and the spouse.  pt with hx bph, s/p turp 5 days ago, c/o urinary retention, w no urine output for past 5 yrs, suprapubic fullness/discomfort, constant, dull, non radiating. States post op from turp w persistent hematuria, had foley d/cd 2 days ago. Was urinating until 5 yrs ago. Is eating and drinking. No fever or chills. No back or flank pain.  Symptoms constant since onset.     Past Medical History  Diagnosis Date  . Hypertension   . Pain     BACK PAIN - HAD BACK SURG OCT 2014 - STILL A LOT OF PAIN  . History of kidney stones   . GERD (gastroesophageal reflux disease)     occasionally   . Arthritis   . BPH (benign prostatic hyperplasia)    Past Surgical History  Procedure Laterality Date  . Tonsillectomy    . Appendectomy    . Neck surgery Left 1978    piece of metal removed  . Colonoscopy w/ biopsies and polypectomy  2013  . Laminectomy with posterior lateral arthrodesis level 1 Left 03/24/2013    Procedure: LEFT LUMBAR FIVE-SACRAL ONE LUMBAR LAMINECTOMY,FACETECTOMY,FORAMINOTOMY AND POSSIBLE MICRODISKECTOMY,POSTERIOR LATERAL ARTHRODESIS;  Surgeon: Hosie Spangle, MD;  Location: [facility];  Service: Neurosurgery;  Laterality: Left;  left  . Transurethral resection of prostate N/A 08/02/2013    Procedure: TRANSURETHRAL RESECTION OF THE PROSTATE WITH GYRUS INSTRUMENTS;  Surgeon: Ailene Rud, MD;  Location: [facility];  Service: Urology;  Laterality: N/A;   History reviewed. No pertinent family history. History  Substance Use Topics  . Smoking status: Former Smoker -- 1.00 packs/day for 10 years    Types: Cigarettes  . Smokeless tobacco: Never Used   . Alcohol Use: Yes     Comment: occasionally ALCOHOL  -  QUIT SMOKING WHEN 4O SOMETH[age] YRS OLD    Review of Systems  Constitutional: Negative for fever and chills.  HENT: Negative for sore throat.   Eyes: Negative for redness.  Respiratory: Negative for shortness of breath.   Cardiovascular: Negative for chest pain.  Gastrointestinal: Negative for vomiting and abdominal pain.  Genitourinary: Positive for hematuria. Negative for flank pain.  Musculoskeletal: Negative for back pain and neck pain.  Skin: Negative for rash.  Neurological: Negative for headaches.  Hematological: Does not bruise/bleed easily.  Psychiatric/Behavioral: Negative for confusion.      Allergies  Review of patient's allergies indicates no known allergies.  Home Medications   Current Outpatient Rx  Name  Route  Sig  Dispense  Refill  . aspirin EC 81 MG tablet   Oral   Take 81 mg by mouth daily.         . ciprofloxacin (CIPRO) 500 MG tablet   Oral   Take 1 tablet (500 mg total) by mouth 2 (two) times daily.   10 tablet   0   . ibuprofen (ADVIL,MOTRIN) 200 MG tablet   Oral   Take 400 mg by mouth every 6 (six) hours as needed for pain.         Marland Kitchen losartan (COZAAR) 50 MG tablet   Oral   Take 25 mg by mouth every other day.          Marland Kitchen  omeprazole (PRILOSEC OTC) 20 MG tablet   Oral   Take 20 mg by mouth daily as needed.         Marland Kitchen oxyCODONE-acetaminophen (ROXICET) 5-325 MG per tablet   Oral   Take 1 tablet by mouth every 4 (four) hours as needed for severe pain.   30 tablet   0   . silodosin (RAPAFLO) 8 MG CAPS capsule   Oral   Take 8 mg by mouth daily with breakfast.          BP 173/99  Pulse 125  Temp(Src) 98.1 F (36.7 C) (Oral)  Resp 20  SpO2 97% Physical Exam  Nursing note and vitals reviewed. Constitutional: He is oriented to person, place, and time. He appears well-developed and well-nourished. No distress.  HENT:  Mouth/Throat: Oropharynx is clear and moist.   Eyes: Pupils are equal, round, and reactive to light.  Neck: Neck supple. No tracheal deviation present.  Cardiovascular: Normal rate.   Pulmonary/Chest: Effort normal. No accessory muscle usage. No respiratory distress.  Abdominal: Soft. He exhibits no mass. There is no rebound and no guarding.  Suprapubic fullness/tenderness.   Genitourinary:  Normal ext genitalia.   Musculoskeletal: Normal range of motion.  Neurological: He is alert and oriented to person, place, and time.  Skin: Skin is warm and dry.  Psychiatric: He has a normal mood and affect.    ED Course  Procedures (including critical care time)     MDM  Foley.   Reviewed nursing notes and prior charts for additional history.   Recheck hr 88, rr 16, afeb.  abd soft nt.   500 cc sl reddish tinged urine, no clots or gross blood.  Symptoms resolved.  Leg bag.   Pt appears stable for d/c.     Mirna Mires, MD 08/08/13 [phone]

## 2013-08-08 NOTE — Discharge Instructions (Signed)
Empty leg bag as need. Do not attempt to remove catheter, as there is a balloon in your bladder - the balloon must first be deflated before catheter is removed or severe bladder/urethral injury can result. Follow up with your urologist this Monday. Return to ER if worse, abdominal pain, fevers, catheter not working/draining, other concern.    Acute Urinary Retention, Male Acute urinary retention is the temporary inability to urinate. This is a common problem in older men. As men age their prostates become larger and block the flow of urine from the bladder. This is usually a problem that has come on gradually.  HOME CARE INSTRUCTIONS If you are sent home with a Foley catheter and a drainage system, you will need to discuss the best course of action with your health care provider. While the catheter is in, maintain a good intake of fluids. Keep the drainage bag emptied and lower than your catheter. This is so that contaminated urine will not flow back into your bladder, which could lead to a urinary tract infection. There are two main types of drainage bags. One is a large bag that usually is used at night. It has a good capacity that will allow you to sleep through the night without having to empty it. The second type is called a leg bag. It has a smaller capacity, so it needs to be emptied more frequently. However, the main advantage is that it can be attached by a leg strap and can go underneath your clothing, allowing you the freedom to move about or leave your home. Only take over-the-counter or prescription medicines for pain, discomfort, or fever as directed by your health care provider.  SEEK MEDICAL CARE IF:  You develop a low-grade fever.  You experience spasms or leakage of urine with the spasms. SEEK IMMEDIATE MEDICAL CARE IF:   You develop chills or fever.  Your catheter stops draining urine.  Your catheter falls out.  You start to develop increased bleeding that does not  respond to rest and increased fluid intake. MAKE SURE YOU:  Understand these instructions.  Will watch your condition.  Will get help right away if you are not doing well or get worse. Document Released: 08/19/2000 Document Revised: 01/13/2013 Document Reviewed: 10/22/2012 [facility] Patient Information 2014 [place], [place].    Foley Catheter Care, Adult A Foley catheter is a soft, flexible tube that is placed into the bladder to drain urine. A Foley catheter may be inserted if:  You leak urine or are not able to control when you urinate (urinary incontinence).  You are not able to urinate when you need to (urinary retention).  You had prostate surgery or surgery on the genitals.  You have certain medical conditions, such as multiple sclerosis, dementia, or a spinal cord injury. If you are going home with a Foley catheter in place, follow the instructions below. TAKING CARE OF THE CATHETER 1. Wash your hands with soap and water. 2. Using mild soap and warm water on a clean washcloth:  Clean the area on your body closest to the catheter insertion site using a circular motion, moving away from the catheter. Never wipe toward the catheter because this could sweep bacteria up into the urethra and cause infection.  Remove all traces of soap. Pat the area dry with a clean towel. For males, reposition the foreskin. 3. Attach the catheter to your leg so there is no tension on the catheter. Use adhesive tape or a leg strap. If you are  using adhesive tape, remove any sticky residue left behind by the previous tape you used. 4. Keep the drainage bag below the level of the bladder, but keep it off the floor. 5. Check throughout the day to be sure the catheter is working and urine is draining freely. Make sure the tubing does not become kinked. 6. Do not pull on the catheter or try to remove it. Pulling could damage internal tissues. TAKING CARE OF THE DRAINAGE BAGS You will be given two  drainage bags to take home. One is a large overnight drainage bag, and the other is a smaller leg bag that fits underneath clothing. You may wear the overnight bag at any time, but you should never wear the smaller leg bag at night. Follow the instructions below for how to empty, change, and clean your drainage bags. Emptying the Drainage Bag You must empty your drainage bag when it is    full or at least 2 3 times a day. 1. Wash your hands with soap and water. 2. Keep the drainage bag below your hips, below the level of your bladder. This stops urine from going back into the tubing and into your bladder. 3. Hold the dirty bag over the toilet or a clean container. 4. Open the pour spout at the bottom of the bag and empty the urine into the toilet or container. Do not let the pour spout touch the toilet, container, or any other surface. Doing so can place bacteria on the bag, which can cause an infection. 5. Clean the pour spout with a gauze pad or cotton ball that has rubbing alcohol on it. 6. Close the pour spout. 7. Attach the bag to your leg with adhesive tape or a leg strap. 8. Wash your hands well. Changing the Drainage Bag Change your drainage bag once a month or sooner if it starts to smell bad or look dirty. Below are steps to follow when changing the drainage bag. 1. Wash your hands with soap and water. 2. Pinch off the rubber catheter so that urine does not spill out. 3. Disconnect the catheter tube from the drainage tube at the connection valve. Do not let the tubes touch any surface. 4. Clean the end of the catheter tube with an alcohol wipe. Use a different alcohol wipe to clean the end of the drainage tube. 5. Connect the catheter tube to the drainage tube of the clean drainage bag. 6. Attach the new bag to the leg with adhesive tape or a leg strap. Avoid attaching the new bag too tightly. 7. Wash your hands well. Cleaning the Drainage Bag 1. Wash your hands with soap and  water. 2. Wash the bag in warm, soapy water. 3. Rinse the bag thoroughly with warm water. 4. Fill the bag with a solution of white vinegar and water (1 cup vinegar to 1 qt warm water [.2 L vinegar to 1 L warm water]). Close the bag and soak it for 30 minutes in the solution. 5. Rinse the bag with warm water. 6. Hang the bag to dry with the pour spout open and hanging downward. 7. Store the clean bag (once it is dry) in a clean plastic bag. 8. Wash your hands well. PREVENTING INFECTION  Wash your hands before and after handling your catheter.  Take showers daily and wash the area where the catheter enters your body. Do not take baths. Replace wet leg straps with dry ones, if this applies.  Do not use powders, sprays,  or lotions on the genital area. Only use creams, lotions, or ointments as directed by your caregiver.  For females, wipe from front to back after each bowel movement.  Drink enough fluids to keep your urine clear or pale yellow unless you have a fluid restriction.  Do not let the drainage bag or tubing touch or lie on the floor.  Wear cotton underwear to absorb moisture and to keep your skin drier. SEEK MEDICAL CARE IF:   Your urine is cloudy or smells unusually bad.  Your catheter becomes clogged.  You are not draining urine into the bag or your bladder feels full.  Your catheter starts to leak. SEEK IMMEDIATE MEDICAL CARE IF:   You have pain, swelling, redness, or pus where the catheter enters the body.  You have pain in the abdomen, legs, lower back, or bladder.  You have a fever.  You see blood fill the catheter, or your urine is pink or red.  You have nausea, vomiting, or chills.  Your catheter gets pulled out. MAKE SURE YOU:   Understand these instructions.  Will watch your condition.  Will get help right away if you are not doing well or get worse. Document Released: 05/13/2005 Document Revised: 09/07/2012 Document Reviewed: 05/04/2012 [facility]  Patient Information 2014 [place].

## 2013-08-09 DIAGNOSIS — N401 Enlarged prostate with lower urinary tract symptoms: Secondary | ICD-10-CM | POA: Diagnosis not present

## 2013-08-09 DIAGNOSIS — N138 Other obstructive and reflux uropathy: Secondary | ICD-10-CM | POA: Diagnosis not present

## 2013-08-23 DIAGNOSIS — N401 Enlarged prostate with lower urinary tract symptoms: Secondary | ICD-10-CM | POA: Diagnosis not present

## 2013-08-23 DIAGNOSIS — N138 Other obstructive and reflux uropathy: Secondary | ICD-10-CM | POA: Diagnosis not present

## 2013-08-24 DIAGNOSIS — N200 Calculus of kidney: Secondary | ICD-10-CM | POA: Diagnosis not present

## 2013-12-02 DIAGNOSIS — Z6826 Body mass index (BMI) 26.0-26.9, adult: Secondary | ICD-10-CM | POA: Diagnosis not present

## 2013-12-02 DIAGNOSIS — I10 Essential (primary) hypertension: Secondary | ICD-10-CM | POA: Diagnosis not present

## 2013-12-02 DIAGNOSIS — M5137 Other intervertebral disc degeneration, lumbosacral region: Secondary | ICD-10-CM | POA: Diagnosis not present

## 2013-12-02 DIAGNOSIS — M48061 Spinal stenosis, lumbar region without neurogenic claudication: Secondary | ICD-10-CM | POA: Diagnosis not present

## 2013-12-02 DIAGNOSIS — M47817 Spondylosis without myelopathy or radiculopathy, lumbosacral region: Secondary | ICD-10-CM | POA: Diagnosis not present

## 2014-01-19 DIAGNOSIS — Z23 Encounter for immunization: Secondary | ICD-10-CM | POA: Diagnosis not present

## 2014-02-03 DIAGNOSIS — H2510 Age-related nuclear cataract, unspecified eye: Secondary | ICD-10-CM | POA: Diagnosis not present

## 2014-02-03 DIAGNOSIS — H33109 Unspecified retinoschisis, unspecified eye: Secondary | ICD-10-CM | POA: Diagnosis not present

## 2014-03-10 DIAGNOSIS — Z23 Encounter for immunization: Secondary | ICD-10-CM | POA: Diagnosis not present

## 2014-04-12 DIAGNOSIS — H2512 Age-related nuclear cataract, left eye: Secondary | ICD-10-CM | POA: Diagnosis not present

## 2014-04-12 DIAGNOSIS — H33103 Unspecified retinoschisis, bilateral: Secondary | ICD-10-CM | POA: Diagnosis not present

## 2014-04-12 DIAGNOSIS — H52203 Unspecified astigmatism, bilateral: Secondary | ICD-10-CM | POA: Diagnosis not present

## 2014-04-12 DIAGNOSIS — Z961 Presence of intraocular lens: Secondary | ICD-10-CM | POA: Diagnosis not present

## 2014-06-01 DIAGNOSIS — D220 Melanocytic nevi of lip: Secondary | ICD-10-CM | POA: Diagnosis not present

## 2014-06-01 DIAGNOSIS — L570 Actinic keratosis: Secondary | ICD-10-CM | POA: Diagnosis not present

## 2014-06-01 DIAGNOSIS — D225 Melanocytic nevi of trunk: Secondary | ICD-10-CM | POA: Diagnosis not present

## 2014-06-01 DIAGNOSIS — D2261 Melanocytic nevi of right upper limb, including shoulder: Secondary | ICD-10-CM | POA: Diagnosis not present

## 2014-06-01 DIAGNOSIS — L814 Other melanin hyperpigmentation: Secondary | ICD-10-CM | POA: Diagnosis not present

## 2014-06-01 DIAGNOSIS — C44519 Basal cell carcinoma of skin of other part of trunk: Secondary | ICD-10-CM | POA: Diagnosis not present

## 2014-06-01 DIAGNOSIS — D485 Neoplasm of uncertain behavior of skin: Secondary | ICD-10-CM | POA: Diagnosis not present

## 2014-06-01 DIAGNOSIS — L821 Other seborrheic keratosis: Secondary | ICD-10-CM | POA: Diagnosis not present

## 2014-06-01 DIAGNOSIS — D2262 Melanocytic nevi of left upper limb, including shoulder: Secondary | ICD-10-CM | POA: Diagnosis not present

## 2014-07-05 DIAGNOSIS — Z0000 Encounter for general adult medical examination without abnormal findings: Secondary | ICD-10-CM | POA: Diagnosis not present

## 2014-07-05 DIAGNOSIS — I10 Essential (primary) hypertension: Secondary | ICD-10-CM | POA: Diagnosis not present

## 2014-07-05 DIAGNOSIS — Z125 Encounter for screening for malignant neoplasm of prostate: Secondary | ICD-10-CM | POA: Diagnosis not present

## 2014-07-11 DIAGNOSIS — Z1212 Encounter for screening for malignant neoplasm of rectum: Secondary | ICD-10-CM | POA: Diagnosis not present

## 2014-07-12 DIAGNOSIS — F339 Major depressive disorder, recurrent, unspecified: Secondary | ICD-10-CM | POA: Diagnosis not present

## 2014-07-12 DIAGNOSIS — Z6827 Body mass index (BMI) 27.0-27.9, adult: Secondary | ICD-10-CM | POA: Diagnosis not present

## 2014-07-12 DIAGNOSIS — C4491 Basal cell carcinoma of skin, unspecified: Secondary | ICD-10-CM | POA: Diagnosis not present

## 2014-07-12 DIAGNOSIS — N401 Enlarged prostate with lower urinary tract symptoms: Secondary | ICD-10-CM | POA: Diagnosis not present

## 2014-07-12 DIAGNOSIS — M1990 Unspecified osteoarthritis, unspecified site: Secondary | ICD-10-CM | POA: Diagnosis not present

## 2014-07-12 DIAGNOSIS — N200 Calculus of kidney: Secondary | ICD-10-CM | POA: Diagnosis not present

## 2014-07-12 DIAGNOSIS — I700 Atherosclerosis of aorta: Secondary | ICD-10-CM | POA: Diagnosis not present

## 2014-07-12 DIAGNOSIS — I10 Essential (primary) hypertension: Secondary | ICD-10-CM | POA: Diagnosis not present

## 2014-07-12 DIAGNOSIS — Z0000 Encounter for general adult medical examination without abnormal findings: Secondary | ICD-10-CM | POA: Diagnosis not present

## 2014-07-12 DIAGNOSIS — K219 Gastro-esophageal reflux disease without esophagitis: Secondary | ICD-10-CM | POA: Diagnosis not present

## 2014-07-12 DIAGNOSIS — Z1389 Encounter for screening for other disorder: Secondary | ICD-10-CM | POA: Diagnosis not present

## 2014-07-12 DIAGNOSIS — M47817 Spondylosis without myelopathy or radiculopathy, lumbosacral region: Secondary | ICD-10-CM | POA: Diagnosis not present

## 2014-08-01 DIAGNOSIS — N138 Other obstructive and reflux uropathy: Secondary | ICD-10-CM | POA: Diagnosis not present

## 2014-08-01 DIAGNOSIS — N200 Calculus of kidney: Secondary | ICD-10-CM | POA: Diagnosis not present

## 2014-08-01 DIAGNOSIS — N401 Enlarged prostate with lower urinary tract symptoms: Secondary | ICD-10-CM | POA: Diagnosis not present

## 2014-08-01 DIAGNOSIS — R351 Nocturia: Secondary | ICD-10-CM | POA: Diagnosis not present

## 2015-03-13 DIAGNOSIS — Z23 Encounter for immunization: Secondary | ICD-10-CM | POA: Diagnosis not present

## 2015-04-11 IMAGING — CT CT ABD-PELV W/ CM
2 of 6 series · 17 of 46 positions shown, 19 images · IV contrast (OMNIPAQUE)
Comparison: CT scan dated 02/03/2013

CLINICAL DATA: Abdominal pain after cystourethroscopy and
transurethral resection of the prostate gland.

EXAM:
CT ABDOMEN AND PELVIS WITH CONTRAST
TECHNIQUE: Multidetector CT imaging of the abdomen and pelvis was performed
using the standard protocol following bolus administration of
intravenous contrast.
CONTRAST:  100mL OMNIPAQUE IOHEXOL 300 MG/ML  SOLN

[Series 2: ct cystogram ap with · axial · 0.89mm/px · z∈[-465,-35]mm · 14 of 98 slices shown, 16 images]
[im 6/98  soft-tissue]
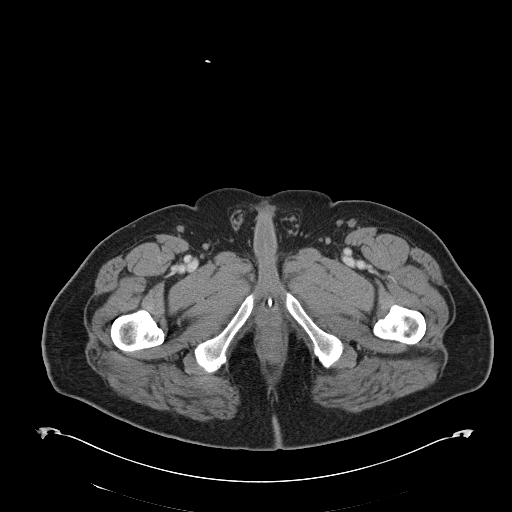
[im 6/98  bone]
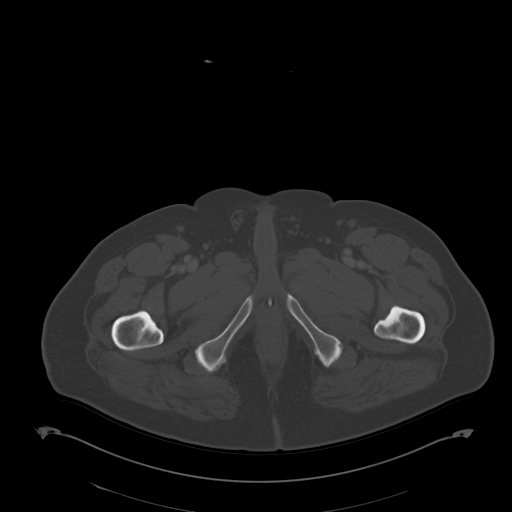
[im 12/98  soft-tissue]
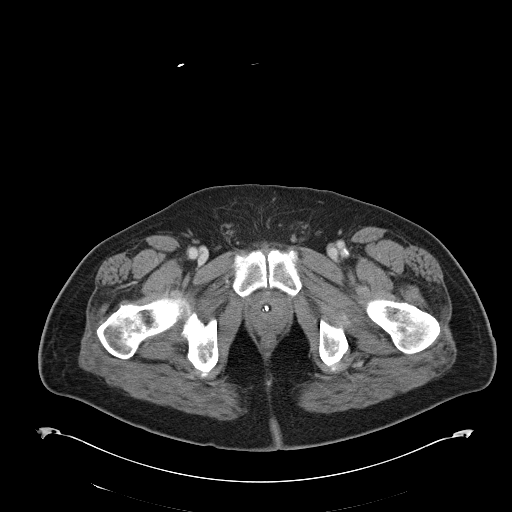
[im 18/98  soft-tissue]
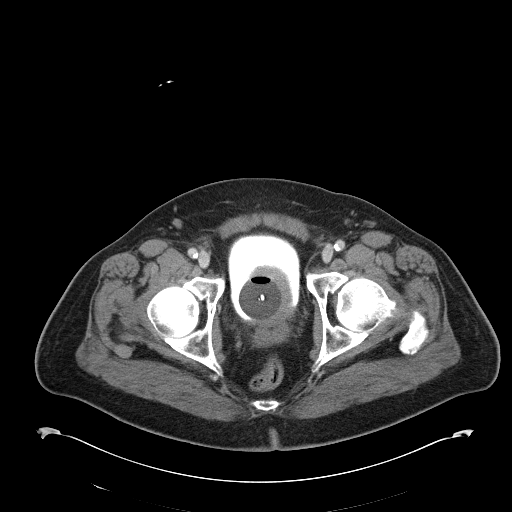
[im 29/98  soft-tissue]
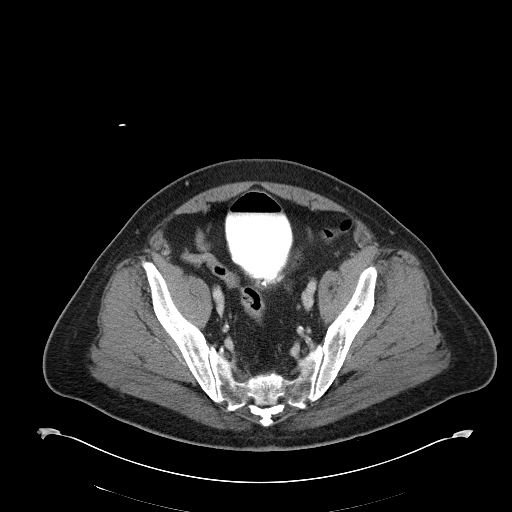
[im 35/98  soft-tissue]
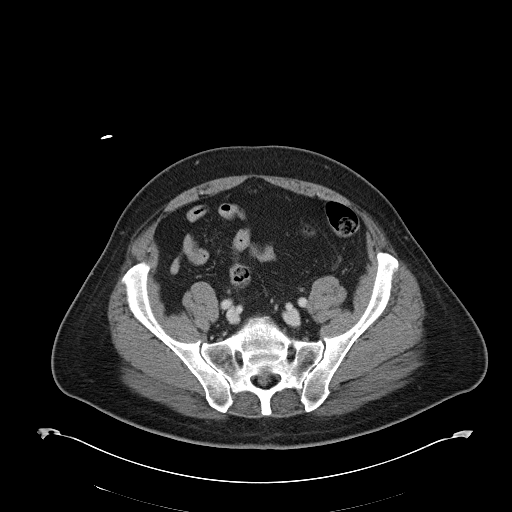
[im 40/98  soft-tissue]
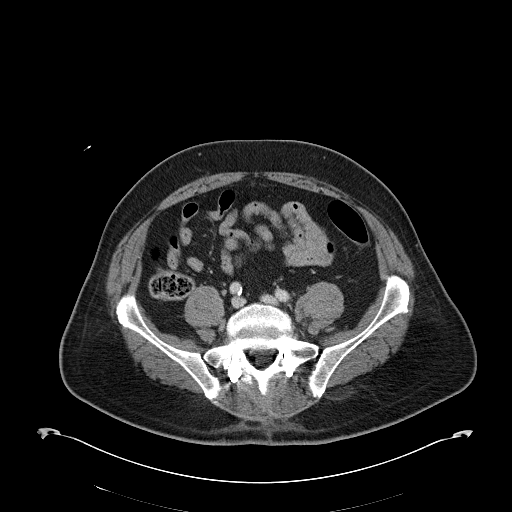
[im 46/98  soft-tissue]
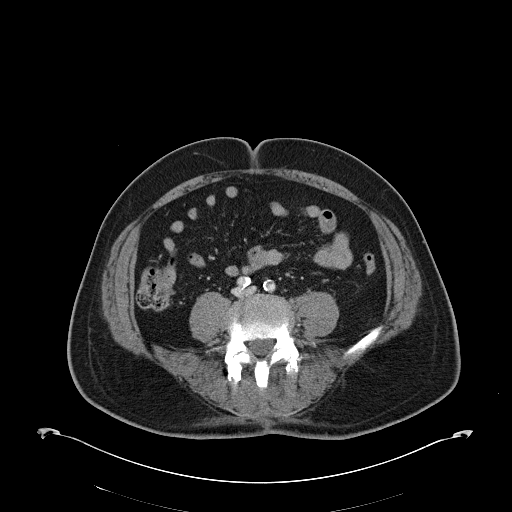
[im 52/98  soft-tissue]
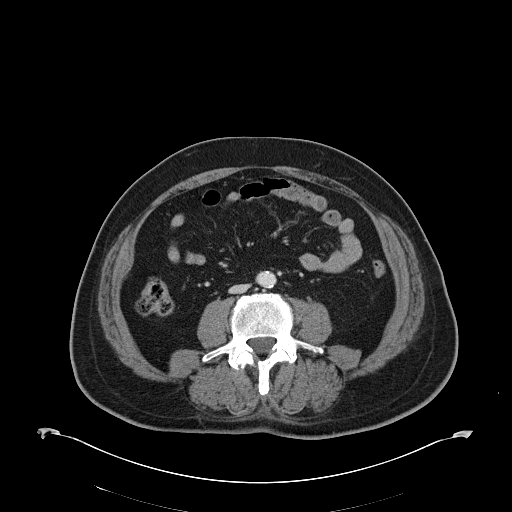
[im 58/98  soft-tissue]
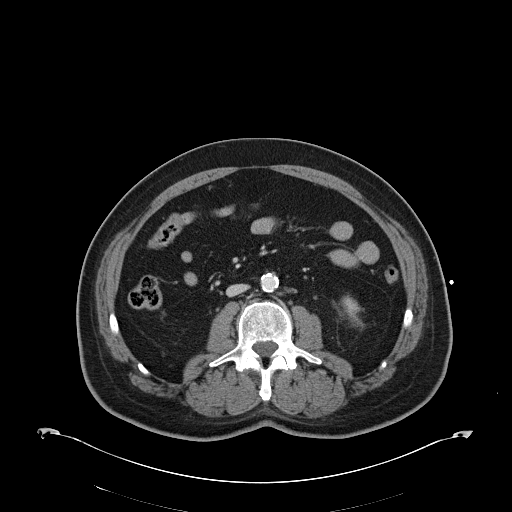
[im 58/98  bone]
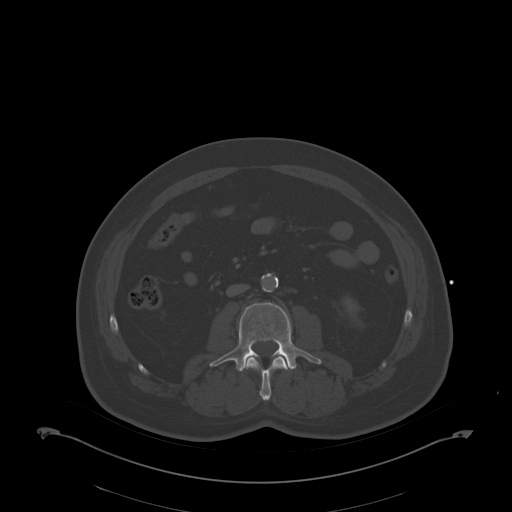
[im 63/98  soft-tissue]
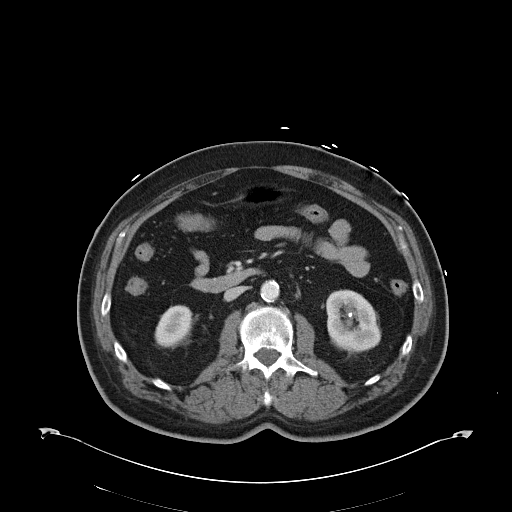
[im 75/98  soft-tissue]
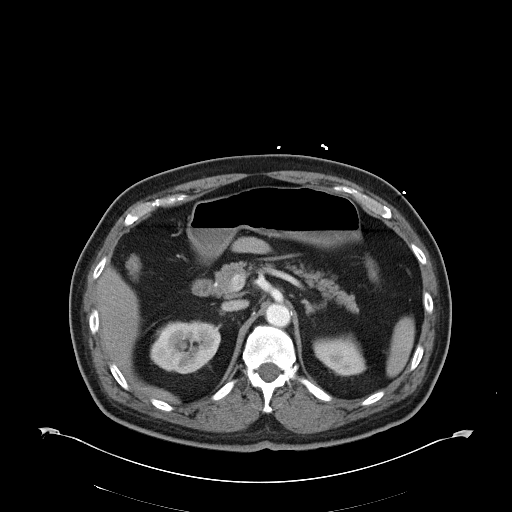
[im 80/98  soft-tissue]
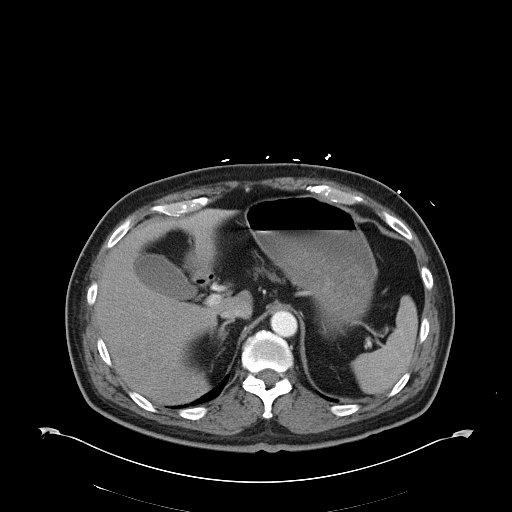
[im 86/98  soft-tissue]
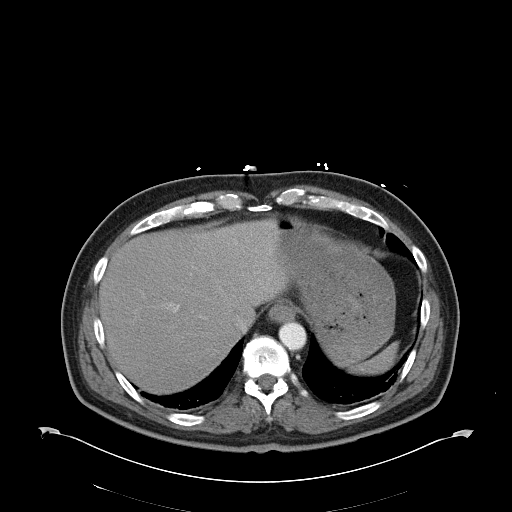
[im 92/98  soft-tissue]
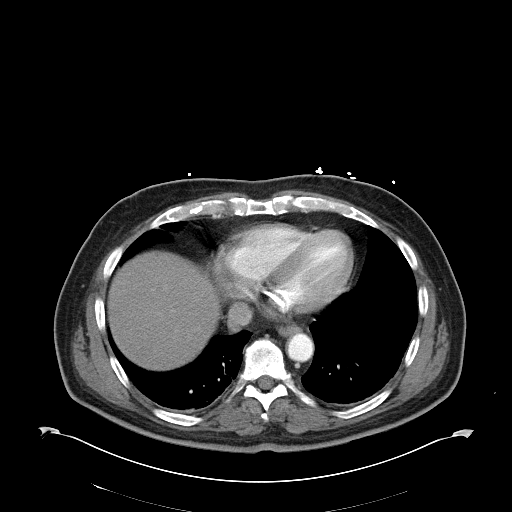

[Series 602: cor · coronal · 0.95mm/px · 3 of 87 slices shown]
[im 29/87  soft-tissue]
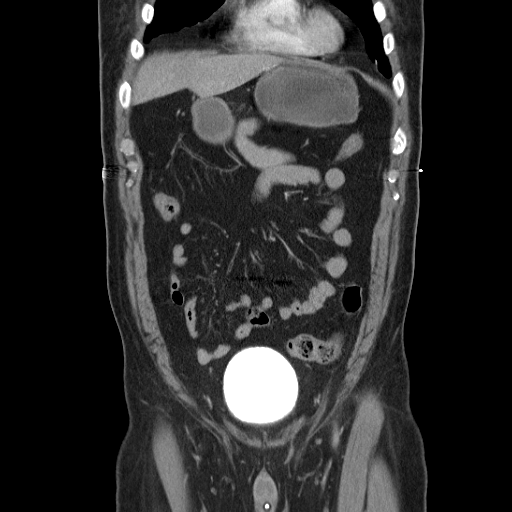
[im 39/87  soft-tissue]
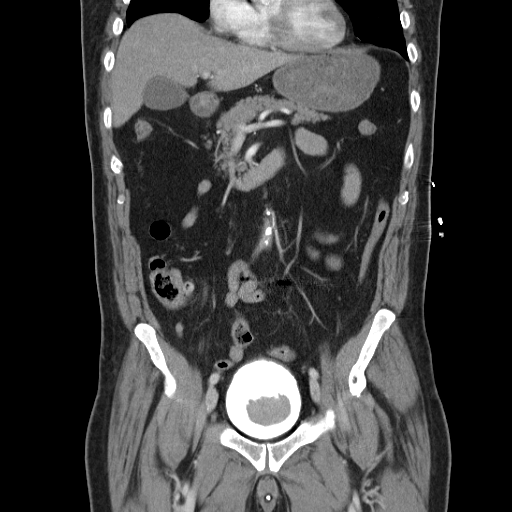
[im 48/87  soft-tissue]
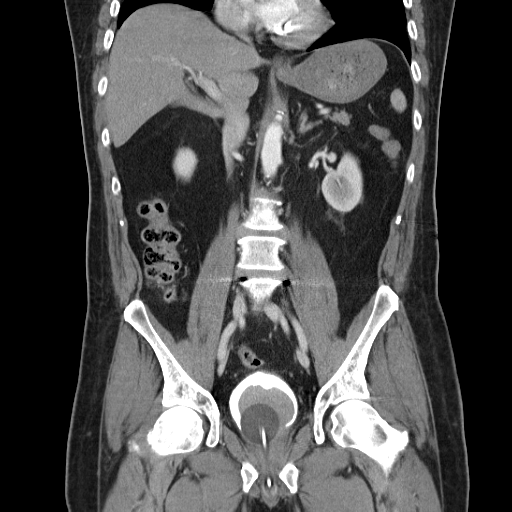

[17 of 46 positions shown; findings below may reference images not displayed]

FINDINGS: There is no evidence of bladder rupture. There is a large clot at
the bladder base. Foley catheter is in place. There is a tiny amount
of reflux into the distal left ureter. Delayed imaging demonstrates
no hydronephrosis or extravasation from the renal collecting
systems. There is a 4.5 mm stone in the lower pole left kidney,
stable.

The liver, biliary tree, spleen, pancreas, adrenal glands, and
kidneys are otherwise normal. No dilated loops of large or small
bowel. Moderate atheromatous irregularity in the left common iliac
artery.

No acute osseous abnormalities.
IMPRESSION: Large clot in the bladder. No evidence of bladder rupture or other
extravasation from the renal collecting systems. No free air or free
fluid.

## 2015-04-12 IMAGING — CR DG CHEST 2V
3 series · 3 of 3 positions shown · non-contrast
Comparison: Portable chest radiograph 08/02/2013 and two-view chest
radiograph 03/18/2013

CLINICAL DATA: Followup widened mediastinum, noted on a recent
portable chest radiograph.

EXAM:
CHEST  2 VIEW

[w chest lat (1 of 2)]
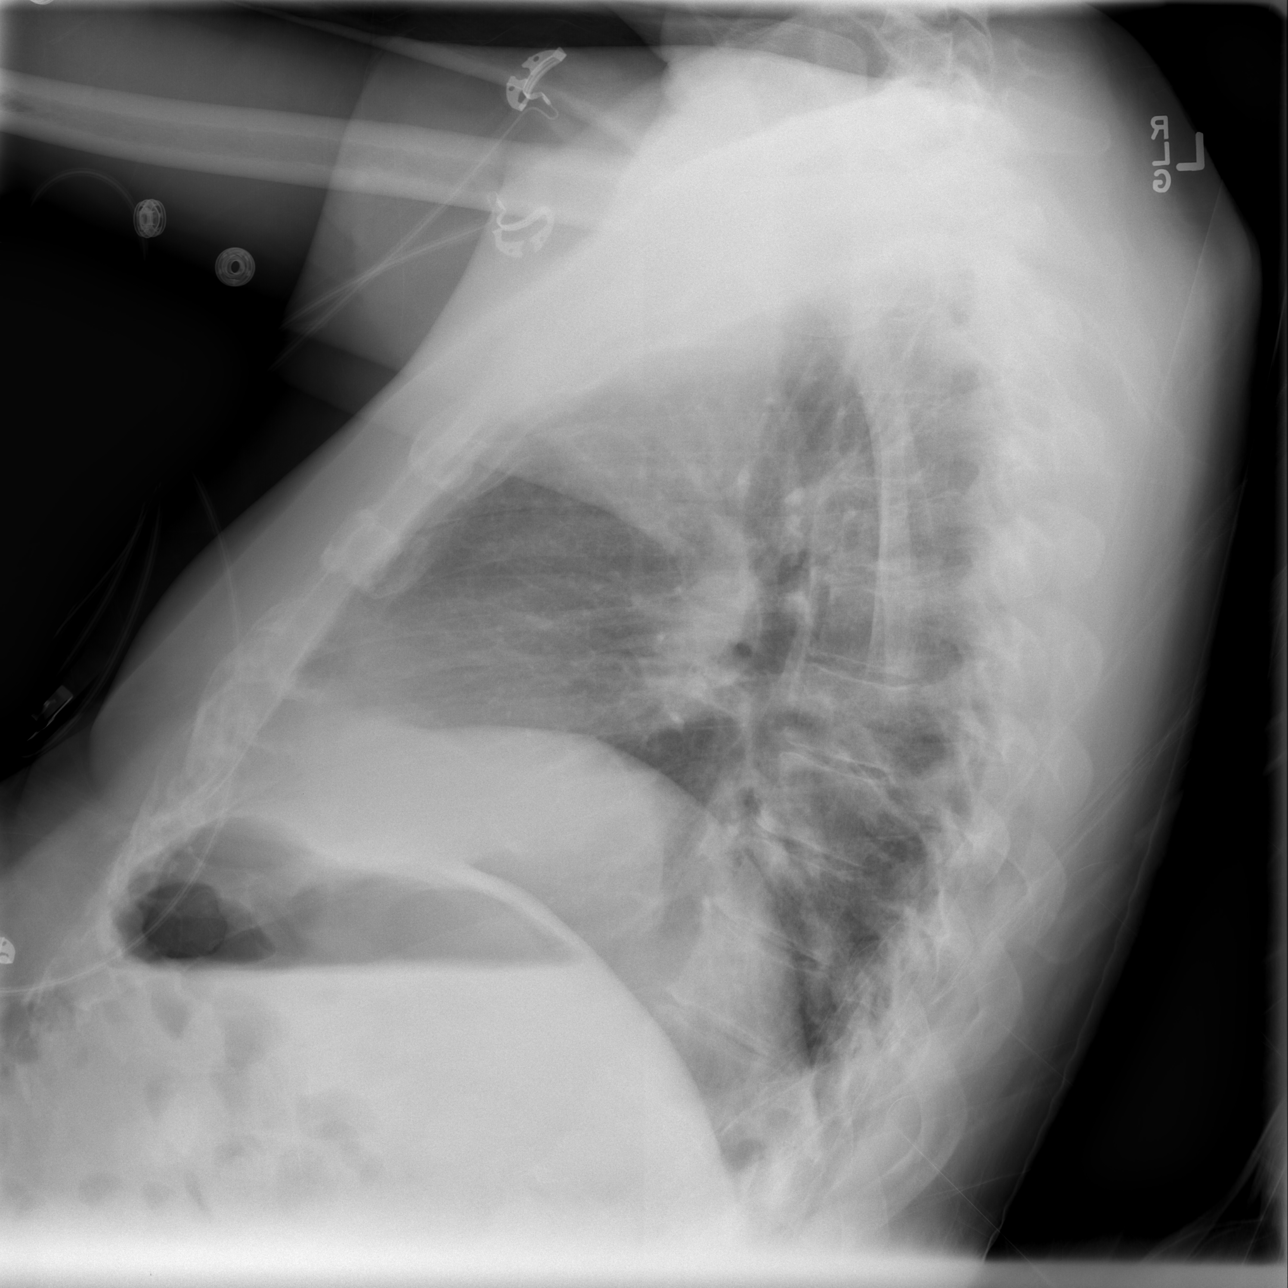

[w chest lat (2 of 2)]
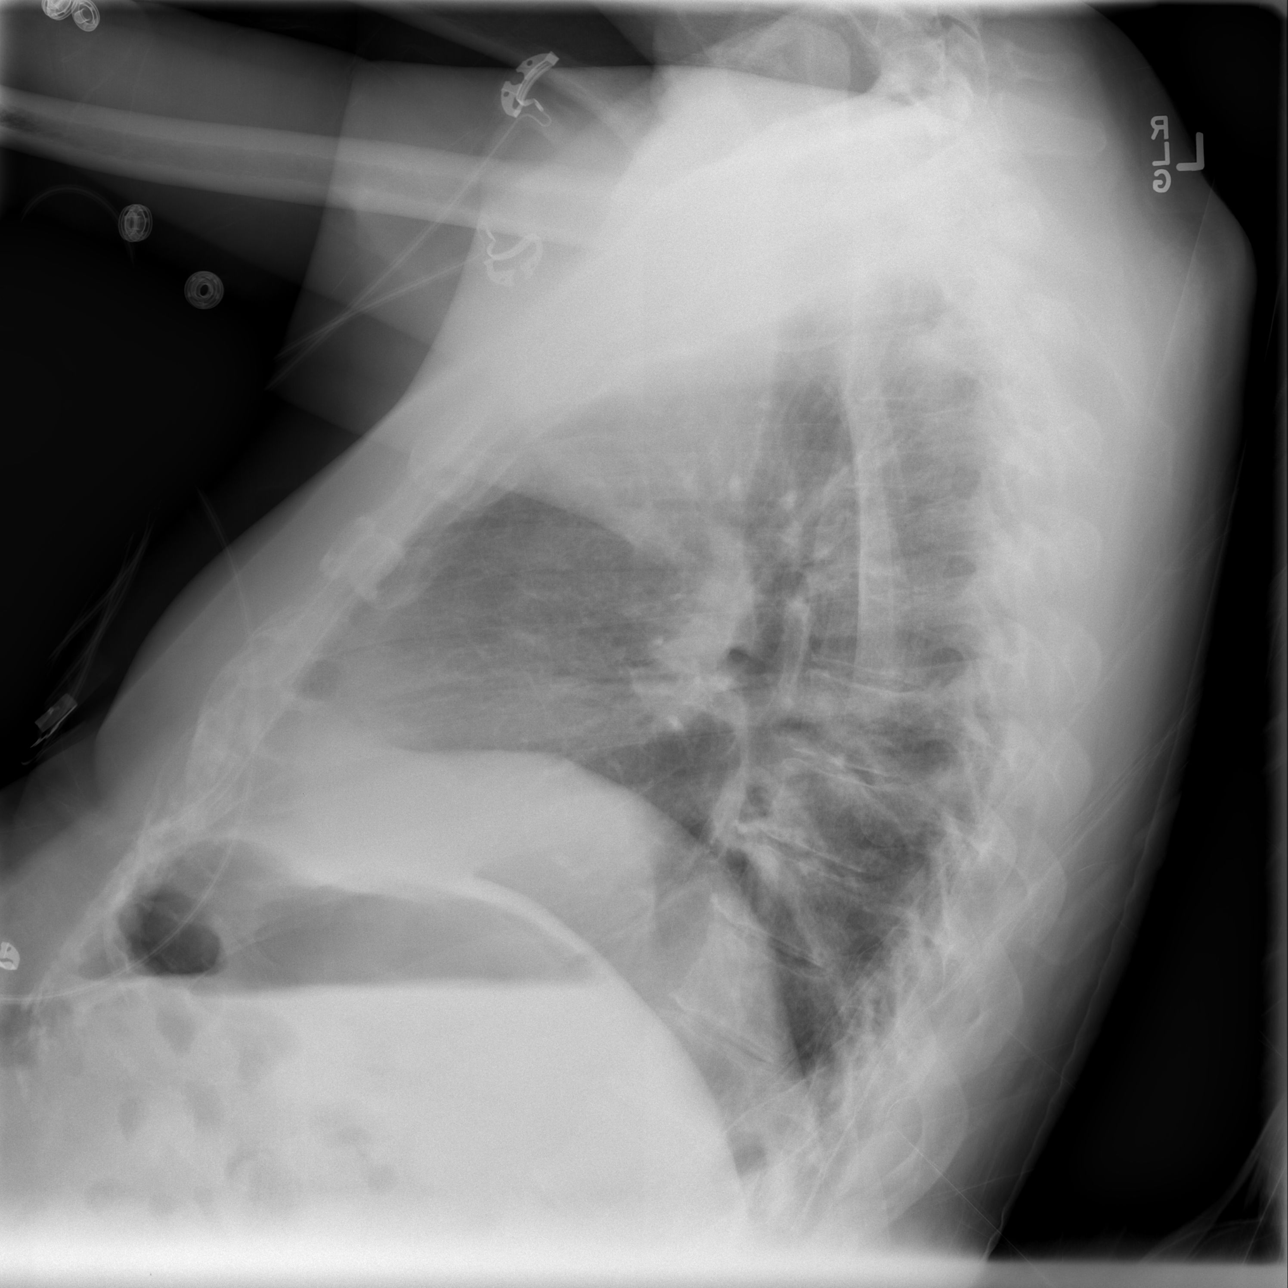

[view not recorded]
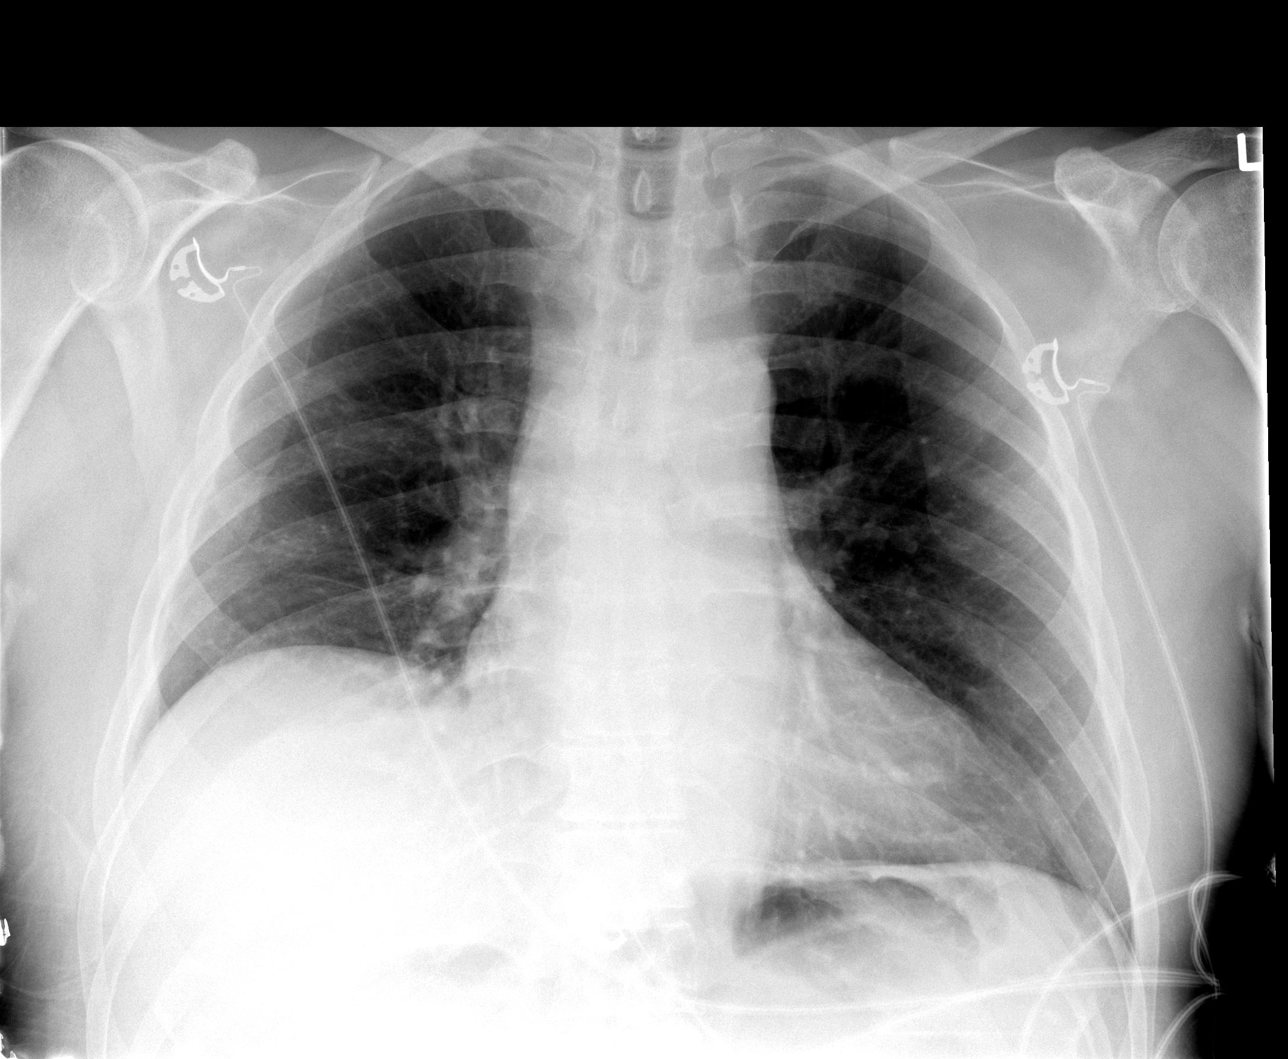

[3 of 3 positions shown; findings below may reference images not displayed]

FINDINGS: The heart size is normal. The mediastinal contours appear stable
compared to a two-view chest radiograph dated 03/18/2013. Findings
on recent portable chest radiograph are consistent with differences
in patient positioning for portable technique.

On the lateral view, the patient's arms overlap of portion of the
anterior chest. No airspace disease, pleural effusion, or
pneumothorax. The trachea is midline. No acute osseous abnormality.
IMPRESSION: No evidence of acute cardiopulmonary disease. The cardiomediastinal
silhouette is within normal limits and stable compared to prior
two-view chest radiograph of 07/19/2012.

## 2015-04-25 DIAGNOSIS — H33103 Unspecified retinoschisis, bilateral: Secondary | ICD-10-CM | POA: Diagnosis not present

## 2015-04-25 DIAGNOSIS — H2512 Age-related nuclear cataract, left eye: Secondary | ICD-10-CM | POA: Diagnosis not present

## 2015-04-25 DIAGNOSIS — H52203 Unspecified astigmatism, bilateral: Secondary | ICD-10-CM | POA: Diagnosis not present

## 2015-06-29 DIAGNOSIS — I10 Essential (primary) hypertension: Secondary | ICD-10-CM | POA: Diagnosis not present

## 2015-06-29 DIAGNOSIS — I700 Atherosclerosis of aorta: Secondary | ICD-10-CM | POA: Diagnosis not present

## 2015-06-29 DIAGNOSIS — Z125 Encounter for screening for malignant neoplasm of prostate: Secondary | ICD-10-CM | POA: Diagnosis not present

## 2015-07-20 DIAGNOSIS — M47817 Spondylosis without myelopathy or radiculopathy, lumbosacral region: Secondary | ICD-10-CM | POA: Diagnosis not present

## 2015-07-20 DIAGNOSIS — K219 Gastro-esophageal reflux disease without esophagitis: Secondary | ICD-10-CM | POA: Diagnosis not present

## 2015-07-20 DIAGNOSIS — I700 Atherosclerosis of aorta: Secondary | ICD-10-CM | POA: Diagnosis not present

## 2015-07-20 DIAGNOSIS — C4491 Basal cell carcinoma of skin, unspecified: Secondary | ICD-10-CM | POA: Diagnosis not present

## 2015-07-20 DIAGNOSIS — N401 Enlarged prostate with lower urinary tract symptoms: Secondary | ICD-10-CM | POA: Diagnosis not present

## 2015-07-20 DIAGNOSIS — N200 Calculus of kidney: Secondary | ICD-10-CM | POA: Diagnosis not present

## 2015-07-20 DIAGNOSIS — Z6826 Body mass index (BMI) 26.0-26.9, adult: Secondary | ICD-10-CM | POA: Diagnosis not present

## 2015-07-20 DIAGNOSIS — I10 Essential (primary) hypertension: Secondary | ICD-10-CM | POA: Diagnosis not present

## 2015-07-20 DIAGNOSIS — Z0000 Encounter for general adult medical examination without abnormal findings: Secondary | ICD-10-CM | POA: Diagnosis not present

## 2015-07-20 DIAGNOSIS — Z1389 Encounter for screening for other disorder: Secondary | ICD-10-CM | POA: Diagnosis not present

## 2015-07-20 DIAGNOSIS — M1990 Unspecified osteoarthritis, unspecified site: Secondary | ICD-10-CM | POA: Diagnosis not present

## 2015-07-21 DIAGNOSIS — Z1212 Encounter for screening for malignant neoplasm of rectum: Secondary | ICD-10-CM | POA: Diagnosis not present

## 2015-08-10 DIAGNOSIS — D1801 Hemangioma of skin and subcutaneous tissue: Secondary | ICD-10-CM | POA: Diagnosis not present

## 2015-08-10 DIAGNOSIS — Z85828 Personal history of other malignant neoplasm of skin: Secondary | ICD-10-CM | POA: Diagnosis not present

## 2015-08-10 DIAGNOSIS — L821 Other seborrheic keratosis: Secondary | ICD-10-CM | POA: Diagnosis not present

## 2015-08-10 DIAGNOSIS — D220 Melanocytic nevi of lip: Secondary | ICD-10-CM | POA: Diagnosis not present

## 2015-08-10 DIAGNOSIS — L814 Other melanin hyperpigmentation: Secondary | ICD-10-CM | POA: Diagnosis not present

## 2015-08-29 DIAGNOSIS — Z0000 Encounter for general adult medical examination without abnormal findings: Secondary | ICD-10-CM | POA: Diagnosis not present

## 2015-08-29 DIAGNOSIS — N401 Enlarged prostate with lower urinary tract symptoms: Secondary | ICD-10-CM | POA: Diagnosis not present

## 2015-08-29 DIAGNOSIS — N138 Other obstructive and reflux uropathy: Secondary | ICD-10-CM | POA: Diagnosis not present

## 2016-03-11 DIAGNOSIS — Z23 Encounter for immunization: Secondary | ICD-10-CM | POA: Diagnosis not present

## 2016-04-29 DIAGNOSIS — H52203 Unspecified astigmatism, bilateral: Secondary | ICD-10-CM | POA: Diagnosis not present

## 2016-04-29 DIAGNOSIS — H2512 Age-related nuclear cataract, left eye: Secondary | ICD-10-CM | POA: Diagnosis not present

## 2016-06-19 DIAGNOSIS — Z86010 Personal history of colonic polyps: Secondary | ICD-10-CM | POA: Diagnosis not present

## 2016-06-19 DIAGNOSIS — D126 Benign neoplasm of colon, unspecified: Secondary | ICD-10-CM | POA: Diagnosis not present

## 2016-06-25 DIAGNOSIS — D126 Benign neoplasm of colon, unspecified: Secondary | ICD-10-CM | POA: Diagnosis not present

## 2016-07-15 ENCOUNTER — Other Ambulatory Visit: Payer: Self-pay | Admitting: Internal Medicine

## 2016-07-15 DIAGNOSIS — N2889 Other specified disorders of kidney and ureter: Principal | ICD-10-CM

## 2016-07-15 DIAGNOSIS — I493 Ventricular premature depolarization: Secondary | ICD-10-CM | POA: Diagnosis not present

## 2016-07-15 DIAGNOSIS — R002 Palpitations: Secondary | ICD-10-CM | POA: Diagnosis not present

## 2016-07-15 DIAGNOSIS — Z6826 Body mass index (BMI) 26.0-26.9, adult: Secondary | ICD-10-CM | POA: Diagnosis not present

## 2016-07-15 DIAGNOSIS — R42 Dizziness and giddiness: Secondary | ICD-10-CM | POA: Insufficient documentation

## 2016-07-15 DIAGNOSIS — I10 Essential (primary) hypertension: Secondary | ICD-10-CM | POA: Diagnosis not present

## 2016-07-15 DIAGNOSIS — I151 Hypertension secondary to other renal disorders: Secondary | ICD-10-CM

## 2016-07-17 ENCOUNTER — Other Ambulatory Visit: Payer: Self-pay | Admitting: Internal Medicine

## 2016-07-17 DIAGNOSIS — R42 Dizziness and giddiness: Secondary | ICD-10-CM

## 2016-07-23 ENCOUNTER — Ambulatory Visit
Admission: RE | Admit: 2016-07-23 | Discharge: 2016-07-23 | Disposition: A | Discharge status: Home / Self Care | Payer: Medicare Other | Source: Ambulatory Visit | Attending: Internal Medicine | Admitting: Internal Medicine

## 2016-07-23 DIAGNOSIS — I6523 Occlusion and stenosis of bilateral carotid arteries: Secondary | ICD-10-CM | POA: Diagnosis not present

## 2016-07-23 DIAGNOSIS — R42 Dizziness and giddiness: Secondary | ICD-10-CM

## 2016-07-25 ENCOUNTER — Other Ambulatory Visit: Payer: Self-pay | Admitting: Internal Medicine

## 2016-07-25 DIAGNOSIS — I6529 Occlusion and stenosis of unspecified carotid artery: Secondary | ICD-10-CM | POA: Insufficient documentation

## 2016-07-25 DIAGNOSIS — E041 Nontoxic single thyroid nodule: Secondary | ICD-10-CM

## 2016-07-30 ENCOUNTER — Ambulatory Visit
Admission: RE | Admit: 2016-07-30 | Discharge: 2016-07-30 | Disposition: A | Discharge status: Home / Self Care | Payer: Medicare Other | Source: Ambulatory Visit | Attending: Internal Medicine | Admitting: Internal Medicine

## 2016-07-30 DIAGNOSIS — I10 Essential (primary) hypertension: Secondary | ICD-10-CM | POA: Diagnosis not present

## 2016-07-30 DIAGNOSIS — E042 Nontoxic multinodular goiter: Secondary | ICD-10-CM | POA: Diagnosis not present

## 2016-07-30 DIAGNOSIS — Z125 Encounter for screening for malignant neoplasm of prostate: Secondary | ICD-10-CM | POA: Diagnosis not present

## 2016-07-30 DIAGNOSIS — E041 Nontoxic single thyroid nodule: Secondary | ICD-10-CM | POA: Diagnosis not present

## 2016-08-01 ENCOUNTER — Other Ambulatory Visit: Payer: Self-pay

## 2016-08-01 ENCOUNTER — Ambulatory Visit (HOSPITAL_COMMUNITY): Payer: Medicare Other | Attending: Internal Medicine

## 2016-08-01 DIAGNOSIS — I493 Ventricular premature depolarization: Secondary | ICD-10-CM | POA: Diagnosis not present

## 2016-08-01 DIAGNOSIS — N2889 Other specified disorders of kidney and ureter: Secondary | ICD-10-CM | POA: Diagnosis not present

## 2016-08-01 DIAGNOSIS — I151 Hypertension secondary to other renal disorders: Secondary | ICD-10-CM | POA: Diagnosis not present

## 2016-08-01 DIAGNOSIS — R002 Palpitations: Secondary | ICD-10-CM

## 2016-08-01 DIAGNOSIS — I340 Nonrheumatic mitral (valve) insufficiency: Secondary | ICD-10-CM | POA: Diagnosis not present

## 2016-08-01 DIAGNOSIS — E041 Nontoxic single thyroid nodule: Secondary | ICD-10-CM | POA: Diagnosis not present

## 2016-08-06 DIAGNOSIS — Z1389 Encounter for screening for other disorder: Secondary | ICD-10-CM | POA: Diagnosis not present

## 2016-08-06 DIAGNOSIS — M1990 Unspecified osteoarthritis, unspecified site: Secondary | ICD-10-CM | POA: Diagnosis not present

## 2016-08-06 DIAGNOSIS — C4491 Basal cell carcinoma of skin, unspecified: Secondary | ICD-10-CM | POA: Diagnosis not present

## 2016-08-06 DIAGNOSIS — I6529 Occlusion and stenosis of unspecified carotid artery: Secondary | ICD-10-CM | POA: Diagnosis not present

## 2016-08-06 DIAGNOSIS — Z0000 Encounter for general adult medical examination without abnormal findings: Secondary | ICD-10-CM | POA: Diagnosis not present

## 2016-08-06 DIAGNOSIS — F338 Other recurrent depressive disorders: Secondary | ICD-10-CM | POA: Diagnosis not present

## 2016-08-06 DIAGNOSIS — I700 Atherosclerosis of aorta: Secondary | ICD-10-CM | POA: Diagnosis not present

## 2016-08-06 DIAGNOSIS — I493 Ventricular premature depolarization: Secondary | ICD-10-CM | POA: Diagnosis not present

## 2016-08-06 DIAGNOSIS — D72819 Decreased white blood cell count, unspecified: Secondary | ICD-10-CM | POA: Insufficient documentation

## 2016-08-06 DIAGNOSIS — R42 Dizziness and giddiness: Secondary | ICD-10-CM | POA: Diagnosis not present

## 2016-08-06 DIAGNOSIS — E041 Nontoxic single thyroid nodule: Secondary | ICD-10-CM | POA: Diagnosis not present

## 2016-08-06 DIAGNOSIS — I10 Essential (primary) hypertension: Secondary | ICD-10-CM | POA: Diagnosis not present

## 2016-08-06 DIAGNOSIS — Z6827 Body mass index (BMI) 27.0-27.9, adult: Secondary | ICD-10-CM | POA: Diagnosis not present

## 2016-08-08 DIAGNOSIS — Z1212 Encounter for screening for malignant neoplasm of rectum: Secondary | ICD-10-CM | POA: Diagnosis not present

## 2016-08-13 ENCOUNTER — Other Ambulatory Visit: Payer: Self-pay | Admitting: Internal Medicine

## 2016-08-14 ENCOUNTER — Other Ambulatory Visit: Payer: Self-pay | Admitting: Internal Medicine

## 2016-08-14 DIAGNOSIS — E041 Nontoxic single thyroid nodule: Secondary | ICD-10-CM

## 2016-08-16 ENCOUNTER — Other Ambulatory Visit (HOSPITAL_COMMUNITY)
Admission: RE | Admit: 2016-08-16 | Discharge: 2016-08-16 | Disposition: A | Discharge status: Home / Self Care | Payer: Medicare Other | Source: Ambulatory Visit | Attending: Physician Assistant | Admitting: Physician Assistant

## 2016-08-16 ENCOUNTER — Ambulatory Visit
Admission: RE | Admit: 2016-08-16 | Discharge: 2016-08-16 | Disposition: A | Discharge status: Home / Self Care | Payer: Medicare Other | Source: Ambulatory Visit | Attending: Internal Medicine | Admitting: Internal Medicine

## 2016-08-16 DIAGNOSIS — E041 Nontoxic single thyroid nodule: Secondary | ICD-10-CM | POA: Diagnosis not present

## 2016-08-16 DIAGNOSIS — E0789 Other specified disorders of thyroid: Secondary | ICD-10-CM | POA: Diagnosis not present

## 2016-08-16 NOTE — Procedures (Signed)
PROCEDURE SUMMARY:  Using direct ultrasound guidance, 4 passes were made using 25 g needles into the nodule within the left lobe of the thyroid.   Ultrasound was used to confirm needle placements on all occasions.   Specimens were sent to Pathology for analysis.  WENDY S BLAIR PA-C 08/16/2016 10:51 AM

## 2016-09-04 DIAGNOSIS — N200 Calculus of kidney: Secondary | ICD-10-CM | POA: Diagnosis not present

## 2016-09-04 DIAGNOSIS — N401 Enlarged prostate with lower urinary tract symptoms: Secondary | ICD-10-CM | POA: Diagnosis not present

## 2016-09-04 DIAGNOSIS — R351 Nocturia: Secondary | ICD-10-CM | POA: Diagnosis not present

## 2016-10-02 DIAGNOSIS — E041 Nontoxic single thyroid nodule: Secondary | ICD-10-CM | POA: Diagnosis not present

## 2016-10-02 DIAGNOSIS — D440 Neoplasm of uncertain behavior of thyroid gland: Secondary | ICD-10-CM | POA: Diagnosis not present

## 2016-10-04 ENCOUNTER — Other Ambulatory Visit: Payer: Self-pay | Admitting: Surgery

## 2016-10-04 DIAGNOSIS — E041 Nontoxic single thyroid nodule: Secondary | ICD-10-CM

## 2016-11-05 ENCOUNTER — Other Ambulatory Visit: Payer: Medicare Other

## 2016-11-07 ENCOUNTER — Other Ambulatory Visit (HOSPITAL_COMMUNITY)
Admission: RE | Admit: 2016-11-07 | Discharge: 2016-11-07 | Disposition: A | Discharge status: Home / Self Care | Payer: Medicare Other | Source: Ambulatory Visit | Attending: Surgery | Admitting: Surgery

## 2016-11-07 ENCOUNTER — Ambulatory Visit
Admission: RE | Admit: 2016-11-07 | Discharge: 2016-11-07 | Disposition: A | Discharge status: Home / Self Care | Payer: Medicare Other | Source: Ambulatory Visit | Attending: Surgery | Admitting: Surgery

## 2016-11-07 DIAGNOSIS — E041 Nontoxic single thyroid nodule: Secondary | ICD-10-CM | POA: Insufficient documentation

## 2017-02-03 DIAGNOSIS — Z6827 Body mass index (BMI) 27.0-27.9, adult: Secondary | ICD-10-CM | POA: Diagnosis not present

## 2017-02-03 DIAGNOSIS — E041 Nontoxic single thyroid nodule: Secondary | ICD-10-CM | POA: Diagnosis not present

## 2017-02-03 DIAGNOSIS — R42 Dizziness and giddiness: Secondary | ICD-10-CM | POA: Diagnosis not present

## 2017-02-03 DIAGNOSIS — Z23 Encounter for immunization: Secondary | ICD-10-CM | POA: Diagnosis not present

## 2017-02-03 DIAGNOSIS — I10 Essential (primary) hypertension: Secondary | ICD-10-CM | POA: Diagnosis not present

## 2017-02-07 ENCOUNTER — Other Ambulatory Visit: Payer: Self-pay | Admitting: Internal Medicine

## 2017-02-07 DIAGNOSIS — E041 Nontoxic single thyroid nodule: Secondary | ICD-10-CM

## 2017-02-14 ENCOUNTER — Other Ambulatory Visit: Payer: Medicare Other

## 2017-05-12 DIAGNOSIS — H2512 Age-related nuclear cataract, left eye: Secondary | ICD-10-CM | POA: Diagnosis not present

## 2017-05-12 DIAGNOSIS — H52203 Unspecified astigmatism, bilateral: Secondary | ICD-10-CM | POA: Diagnosis not present

## 2017-08-04 DIAGNOSIS — Z125 Encounter for screening for malignant neoplasm of prostate: Secondary | ICD-10-CM | POA: Diagnosis not present

## 2017-08-04 DIAGNOSIS — R82998 Other abnormal findings in urine: Secondary | ICD-10-CM | POA: Diagnosis not present

## 2017-08-04 DIAGNOSIS — I10 Essential (primary) hypertension: Secondary | ICD-10-CM | POA: Diagnosis not present

## 2017-08-11 DIAGNOSIS — Z23 Encounter for immunization: Secondary | ICD-10-CM | POA: Diagnosis not present

## 2017-08-11 DIAGNOSIS — N401 Enlarged prostate with lower urinary tract symptoms: Secondary | ICD-10-CM | POA: Diagnosis not present

## 2017-08-11 DIAGNOSIS — R3121 Asymptomatic microscopic hematuria: Secondary | ICD-10-CM | POA: Diagnosis not present

## 2017-08-11 DIAGNOSIS — M5416 Radiculopathy, lumbar region: Secondary | ICD-10-CM | POA: Diagnosis not present

## 2017-08-11 DIAGNOSIS — F338 Other recurrent depressive disorders: Secondary | ICD-10-CM | POA: Diagnosis not present

## 2017-08-11 DIAGNOSIS — R809 Proteinuria, unspecified: Secondary | ICD-10-CM | POA: Insufficient documentation

## 2017-08-11 DIAGNOSIS — Z6828 Body mass index (BMI) 28.0-28.9, adult: Secondary | ICD-10-CM | POA: Diagnosis not present

## 2017-08-11 DIAGNOSIS — K219 Gastro-esophageal reflux disease without esophagitis: Secondary | ICD-10-CM | POA: Diagnosis not present

## 2017-08-11 DIAGNOSIS — I700 Atherosclerosis of aorta: Secondary | ICD-10-CM | POA: Diagnosis not present

## 2017-08-11 DIAGNOSIS — N200 Calculus of kidney: Secondary | ICD-10-CM | POA: Diagnosis not present

## 2017-08-11 DIAGNOSIS — E559 Vitamin D deficiency, unspecified: Secondary | ICD-10-CM | POA: Diagnosis not present

## 2017-08-11 DIAGNOSIS — R5383 Other fatigue: Secondary | ICD-10-CM | POA: Insufficient documentation

## 2017-08-11 DIAGNOSIS — R808 Other proteinuria: Secondary | ICD-10-CM | POA: Diagnosis not present

## 2017-08-11 DIAGNOSIS — Z0000 Encounter for general adult medical examination without abnormal findings: Secondary | ICD-10-CM | POA: Diagnosis not present

## 2017-08-11 DIAGNOSIS — Z1389 Encounter for screening for other disorder: Secondary | ICD-10-CM | POA: Diagnosis not present

## 2017-08-11 DIAGNOSIS — I10 Essential (primary) hypertension: Secondary | ICD-10-CM | POA: Diagnosis not present

## 2017-08-12 DIAGNOSIS — M549 Dorsalgia, unspecified: Secondary | ICD-10-CM | POA: Diagnosis not present

## 2017-08-12 DIAGNOSIS — R310 Gross hematuria: Secondary | ICD-10-CM | POA: Diagnosis not present

## 2017-08-14 DIAGNOSIS — Z1212 Encounter for screening for malignant neoplasm of rectum: Secondary | ICD-10-CM | POA: Diagnosis not present

## 2017-08-21 DIAGNOSIS — R310 Gross hematuria: Secondary | ICD-10-CM | POA: Diagnosis not present

## 2017-08-21 DIAGNOSIS — N132 Hydronephrosis with renal and ureteral calculous obstruction: Secondary | ICD-10-CM | POA: Diagnosis not present

## 2017-08-28 DIAGNOSIS — D0339 Melanoma in situ of other parts of face: Secondary | ICD-10-CM | POA: Diagnosis not present

## 2017-08-28 DIAGNOSIS — C44319 Basal cell carcinoma of skin of other parts of face: Secondary | ICD-10-CM | POA: Diagnosis not present

## 2017-08-28 DIAGNOSIS — C4331 Malignant melanoma of nose: Secondary | ICD-10-CM | POA: Diagnosis not present

## 2017-08-28 DIAGNOSIS — D485 Neoplasm of uncertain behavior of skin: Secondary | ICD-10-CM | POA: Diagnosis not present

## 2017-08-28 DIAGNOSIS — Z85828 Personal history of other malignant neoplasm of skin: Secondary | ICD-10-CM | POA: Diagnosis not present

## 2017-09-03 DIAGNOSIS — Z85828 Personal history of other malignant neoplasm of skin: Secondary | ICD-10-CM | POA: Diagnosis not present

## 2017-09-03 DIAGNOSIS — C44319 Basal cell carcinoma of skin of other parts of face: Secondary | ICD-10-CM | POA: Diagnosis not present

## 2017-09-08 ENCOUNTER — Other Ambulatory Visit: Payer: Self-pay | Admitting: Urology

## 2017-09-08 DIAGNOSIS — N200 Calculus of kidney: Secondary | ICD-10-CM | POA: Diagnosis not present

## 2017-09-09 ENCOUNTER — Other Ambulatory Visit: Payer: Self-pay

## 2017-09-09 ENCOUNTER — Encounter (HOSPITAL_COMMUNITY): Payer: Self-pay | Admitting: *Deleted

## 2017-09-10 NOTE — H&P (Signed)
Urology Preoperative H&P   Chief Complaint: Left flank pain  History of Present Illness: Brent Hanna is a 77 y.o. male with a 3 week history of left flank pain. CTSS from 08/21/17 shows a 12x8 cm left renal stone with mild hydronephrosis.   Currently, his flank pain has improved, but is still having intermittent episodes of dull left flank pain without radiation. He denies nausea/vomiting or fever/chills. He is urinating without difficulty and denies dysuria or hematuria.   Last PSA- 1.8 (07/2017)-- stable. PSA is being checked with Dr. Virgina Jock.     Past Medical History:  Diagnosis Date  . Arthritis   . BPH (benign prostatic hyperplasia)   . GERD (gastroesophageal reflux disease)    occasionally   . History of kidney stones   . Hypertension   . Pain    BACK PAIN - HAD BACK SURG OCT 2014 - STILL A LOT OF PAIN    Past Surgical History:  Procedure Laterality Date  . APPENDECTOMY    . COLONOSCOPY W/ BIOPSIES AND POLYPECTOMY  2013  . LAMINECTOMY WITH POSTERIOR LATERAL ARTHRODESIS LEVEL 1 Left 03/24/2013   Procedure: LEFT LUMBAR FIVE-SACRAL ONE LUMBAR LAMINECTOMY,FACETECTOMY,FORAMINOTOMY AND POSSIBLE MICRODISKECTOMY,POSTERIOR LATERAL ARTHRODESIS;  Surgeon: Hosie Spangle, MD;  Location: [facility];  Service: Neurosurgery;  Laterality: Left;  left  . NECK SURGERY Left 1978   piece of metal removed  . TONSILLECTOMY    . TRANSURETHRAL RESECTION OF PROSTATE N/A 08/02/2013   Procedure: TRANSURETHRAL RESECTION OF THE PROSTATE WITH GYRUS INSTRUMENTS;  Surgeon: Ailene Rud, MD;  Location: [facility];  Service: Urology;  Laterality: N/A;    Allergies: No Known Allergies  History reviewed. No pertinent family history.  Social History:  reports that he has quit smoking. His smoking use included cigarettes. He has a 10.00 pack-year smoking history. He has never used smokeless tobacco. He reports that he drinks alcohol. He reports that he does not use drugs.  ROS: A complete review  of systems was performed.  All systems are negative except for pertinent findings as noted.  Physical Exam:  Vital signs in last 24 hours:   Constitutional:  Alert and oriented, No acute distress Cardiovascular: Regular rate and rhythm, No JVD Respiratory: Normal respiratory effort, Lungs clear bilaterally GI: Abdomen is soft, nontender, nondistended, no abdominal masses GU: No CVA tenderness Lymphatic: No lymphadenopathy Neurologic: Grossly intact, no focal deficits Psychiatric: Normal mood and affect  Laboratory Data:  No results for input(s): WBC, HGB, HCT, PLT in the last 72 hours.  No results for input(s): NA, K, CL, GLUCOSE, BUN, CALCIUM, CREATININE in the last 72 hours.  Invalid input(s): CO3   No results found for this or any previous visit (from the past 24 hour(s)). No results found for this or any previous visit (from the past 240 hour(s)).  Renal Function: No results for input(s): CREATININE in the last 168 hours. CrCl cannot be calculated (Patient's most recent lab result is older than the maximum 21 days allowed.).  Radiologic Imaging: No results found.  I independently reviewed the above imaging studies.  Assessment and Plan Brent Hanna is a 77 y.o. male with 12 x 8 cm left proximal ureteral stone  The risks, benefits and alternatives of LEFT ESWL was discussed with the patient. I described the risks which include arrhythmia, kidney contusion, kidney hemorrhage, need for transfusion, back discomfort, flank ecchymosis, flank abrasion, inability to break up stone, inability to pass stone fragments, Steinstrasse, infection associated with obstructing stones, need  for different surgical procedure and possible need for repeat shockwave lithotripsy.  The patient voices understanding and wishes to proceed.        Ellison Hughs, MD 09/10/2017, 9:55 AM  [facility] Pager: [phone]

## 2017-09-11 ENCOUNTER — Ambulatory Visit (HOSPITAL_COMMUNITY): Payer: Medicare Other

## 2017-09-11 ENCOUNTER — Ambulatory Visit (HOSPITAL_COMMUNITY)
Admission: RE | Admit: 2017-09-11 | Discharge: 2017-09-11 | Disposition: A | Discharge status: Home / Self Care | Payer: Medicare Other | Source: Ambulatory Visit | Attending: Urology | Admitting: Urology

## 2017-09-11 ENCOUNTER — Encounter (HOSPITAL_COMMUNITY): Payer: Self-pay | Admitting: General Practice

## 2017-09-11 ENCOUNTER — Encounter (HOSPITAL_COMMUNITY)
Admission: RE | Disposition: A | Discharge status: Home / Self Care | Payer: Self-pay | Source: Ambulatory Visit | Attending: Urology

## 2017-09-11 DIAGNOSIS — N132 Hydronephrosis with renal and ureteral calculous obstruction: Secondary | ICD-10-CM | POA: Insufficient documentation

## 2017-09-11 DIAGNOSIS — N200 Calculus of kidney: Secondary | ICD-10-CM | POA: Diagnosis not present

## 2017-09-11 DIAGNOSIS — I10 Essential (primary) hypertension: Secondary | ICD-10-CM | POA: Insufficient documentation

## 2017-09-11 DIAGNOSIS — M1990 Unspecified osteoarthritis, unspecified site: Secondary | ICD-10-CM | POA: Diagnosis not present

## 2017-09-11 DIAGNOSIS — Z87891 Personal history of nicotine dependence: Secondary | ICD-10-CM | POA: Insufficient documentation

## 2017-09-11 DIAGNOSIS — N201 Calculus of ureter: Secondary | ICD-10-CM

## 2017-09-11 HISTORY — PX: EXTRACORPOREAL SHOCK WAVE LITHOTRIPSY: SHX1557

## 2017-09-11 SURGERY — LITHOTRIPSY, ESWL
Anesthesia: LOCAL | Laterality: Left

## 2017-09-11 MED ORDER — DIPHENHYDRAMINE HCL 25 MG PO CAPS
25.0000 mg | ORAL_CAPSULE | ORAL | Status: AC
  Administered 2017-09-11: 25 mg via ORAL
  Filled 2017-09-11: qty 1

## 2017-09-11 MED ORDER — DIAZEPAM 5 MG PO TABS
10.0000 mg | ORAL_TABLET | ORAL | Status: AC
  Administered 2017-09-11: 10 mg via ORAL
  Filled 2017-09-11: qty 2

## 2017-09-11 MED ORDER — CIPROFLOXACIN HCL 500 MG PO TABS
500.0000 mg | ORAL_TABLET | ORAL | Status: AC
  Administered 2017-09-11: 500 mg via ORAL
  Filled 2017-09-11: qty 1

## 2017-09-11 MED ORDER — SODIUM CHLORIDE 0.9 % IV SOLN
INTRAVENOUS | Status: DC
  Administered 2017-09-11: 09:00:00 via INTRAVENOUS

## 2017-09-11 NOTE — Op Note (Signed)
[facility] Operative Note  Treating Physician: Ellison Hughs, MD  Pre-op diagnosis: 12 mm left UPJ stone  Post-op diagnosis: Same   Procedure: Left ESWL  See Aris Everts OP note scanned into chart. Also because of the size, density, location and other factors that cannot be anticipated I feel this will likely be a staged procedure. This fact supersedes any indication in the scanned [place] stone operative note to the contrary

## 2017-09-12 ENCOUNTER — Encounter (HOSPITAL_COMMUNITY): Payer: Self-pay | Admitting: Urology

## 2017-09-29 DIAGNOSIS — Z85828 Personal history of other malignant neoplasm of skin: Secondary | ICD-10-CM | POA: Diagnosis not present

## 2017-09-29 DIAGNOSIS — C4331 Malignant melanoma of nose: Secondary | ICD-10-CM | POA: Diagnosis not present

## 2017-09-29 DIAGNOSIS — L989 Disorder of the skin and subcutaneous tissue, unspecified: Secondary | ICD-10-CM | POA: Diagnosis not present

## 2017-09-30 DIAGNOSIS — N201 Calculus of ureter: Secondary | ICD-10-CM | POA: Diagnosis not present

## 2017-09-30 DIAGNOSIS — C4331 Malignant melanoma of nose: Secondary | ICD-10-CM | POA: Diagnosis not present

## 2017-10-08 DIAGNOSIS — L738 Other specified follicular disorders: Secondary | ICD-10-CM | POA: Diagnosis not present

## 2017-10-08 DIAGNOSIS — L570 Actinic keratosis: Secondary | ICD-10-CM | POA: Diagnosis not present

## 2017-10-08 DIAGNOSIS — D1801 Hemangioma of skin and subcutaneous tissue: Secondary | ICD-10-CM | POA: Diagnosis not present

## 2017-10-08 DIAGNOSIS — L218 Other seborrheic dermatitis: Secondary | ICD-10-CM | POA: Diagnosis not present

## 2017-10-08 DIAGNOSIS — Z85828 Personal history of other malignant neoplasm of skin: Secondary | ICD-10-CM | POA: Diagnosis not present

## 2017-10-08 DIAGNOSIS — D225 Melanocytic nevi of trunk: Secondary | ICD-10-CM | POA: Diagnosis not present

## 2017-10-08 DIAGNOSIS — L814 Other melanin hyperpigmentation: Secondary | ICD-10-CM | POA: Diagnosis not present

## 2017-10-08 DIAGNOSIS — L821 Other seborrheic keratosis: Secondary | ICD-10-CM | POA: Diagnosis not present

## 2017-10-17 ENCOUNTER — Other Ambulatory Visit: Payer: Self-pay | Admitting: Surgery

## 2017-10-17 DIAGNOSIS — E041 Nontoxic single thyroid nodule: Secondary | ICD-10-CM

## 2017-10-17 DIAGNOSIS — D440 Neoplasm of uncertain behavior of thyroid gland: Secondary | ICD-10-CM

## 2017-10-21 ENCOUNTER — Ambulatory Visit
Admission: RE | Admit: 2017-10-21 | Discharge: 2017-10-21 | Disposition: A | Discharge status: Home / Self Care | Payer: Medicare Other | Source: Ambulatory Visit | Attending: Surgery | Admitting: Surgery

## 2017-10-21 DIAGNOSIS — E041 Nontoxic single thyroid nodule: Secondary | ICD-10-CM | POA: Diagnosis not present

## 2017-10-21 DIAGNOSIS — D440 Neoplasm of uncertain behavior of thyroid gland: Secondary | ICD-10-CM

## 2018-01-02 DIAGNOSIS — D440 Neoplasm of uncertain behavior of thyroid gland: Secondary | ICD-10-CM | POA: Diagnosis not present

## 2018-01-02 DIAGNOSIS — E041 Nontoxic single thyroid nodule: Secondary | ICD-10-CM | POA: Diagnosis not present

## 2018-02-16 DIAGNOSIS — I700 Atherosclerosis of aorta: Secondary | ICD-10-CM | POA: Diagnosis not present

## 2018-02-16 DIAGNOSIS — C439 Malignant melanoma of skin, unspecified: Secondary | ICD-10-CM | POA: Diagnosis not present

## 2018-02-16 DIAGNOSIS — Z6827 Body mass index (BMI) 27.0-27.9, adult: Secondary | ICD-10-CM | POA: Diagnosis not present

## 2018-02-16 DIAGNOSIS — M1990 Unspecified osteoarthritis, unspecified site: Secondary | ICD-10-CM | POA: Diagnosis not present

## 2018-02-16 DIAGNOSIS — Z23 Encounter for immunization: Secondary | ICD-10-CM | POA: Diagnosis not present

## 2018-02-16 DIAGNOSIS — N200 Calculus of kidney: Secondary | ICD-10-CM | POA: Diagnosis not present

## 2018-02-16 DIAGNOSIS — N401 Enlarged prostate with lower urinary tract symptoms: Secondary | ICD-10-CM | POA: Diagnosis not present

## 2018-02-16 DIAGNOSIS — Z1389 Encounter for screening for other disorder: Secondary | ICD-10-CM | POA: Diagnosis not present

## 2018-02-16 DIAGNOSIS — I10 Essential (primary) hypertension: Secondary | ICD-10-CM | POA: Diagnosis not present

## 2018-02-16 DIAGNOSIS — D649 Anemia, unspecified: Secondary | ICD-10-CM | POA: Diagnosis not present

## 2018-02-16 DIAGNOSIS — E041 Nontoxic single thyroid nodule: Secondary | ICD-10-CM | POA: Diagnosis not present

## 2018-04-15 DIAGNOSIS — R05 Cough: Secondary | ICD-10-CM | POA: Diagnosis not present

## 2018-04-15 DIAGNOSIS — J019 Acute sinusitis, unspecified: Secondary | ICD-10-CM | POA: Diagnosis not present

## 2018-04-15 DIAGNOSIS — Z6828 Body mass index (BMI) 28.0-28.9, adult: Secondary | ICD-10-CM | POA: Diagnosis not present

## 2018-04-15 DIAGNOSIS — H6692 Otitis media, unspecified, left ear: Secondary | ICD-10-CM | POA: Diagnosis not present

## 2018-04-17 DIAGNOSIS — Z85828 Personal history of other malignant neoplasm of skin: Secondary | ICD-10-CM | POA: Diagnosis not present

## 2018-04-17 DIAGNOSIS — L814 Other melanin hyperpigmentation: Secondary | ICD-10-CM | POA: Diagnosis not present

## 2018-04-17 DIAGNOSIS — Z85820 Personal history of malignant melanoma of skin: Secondary | ICD-10-CM | POA: Diagnosis not present

## 2018-04-17 DIAGNOSIS — D1801 Hemangioma of skin and subcutaneous tissue: Secondary | ICD-10-CM | POA: Diagnosis not present

## 2018-04-17 DIAGNOSIS — L821 Other seborrheic keratosis: Secondary | ICD-10-CM | POA: Diagnosis not present

## 2018-04-17 DIAGNOSIS — L570 Actinic keratosis: Secondary | ICD-10-CM | POA: Diagnosis not present

## 2018-04-17 DIAGNOSIS — L918 Other hypertrophic disorders of the skin: Secondary | ICD-10-CM | POA: Diagnosis not present

## 2018-05-15 DIAGNOSIS — H52203 Unspecified astigmatism, bilateral: Secondary | ICD-10-CM | POA: Diagnosis not present

## 2018-05-15 DIAGNOSIS — H2512 Age-related nuclear cataract, left eye: Secondary | ICD-10-CM | POA: Diagnosis not present

## 2018-08-07 DIAGNOSIS — I10 Essential (primary) hypertension: Secondary | ICD-10-CM | POA: Diagnosis not present

## 2018-08-07 DIAGNOSIS — R82998 Other abnormal findings in urine: Secondary | ICD-10-CM | POA: Diagnosis not present

## 2018-08-07 DIAGNOSIS — Z125 Encounter for screening for malignant neoplasm of prostate: Secondary | ICD-10-CM | POA: Diagnosis not present

## 2018-08-07 DIAGNOSIS — E041 Nontoxic single thyroid nodule: Secondary | ICD-10-CM | POA: Diagnosis not present

## 2018-08-11 DIAGNOSIS — Z1212 Encounter for screening for malignant neoplasm of rectum: Secondary | ICD-10-CM | POA: Diagnosis not present

## 2018-08-14 DIAGNOSIS — R808 Other proteinuria: Secondary | ICD-10-CM | POA: Diagnosis not present

## 2018-08-14 DIAGNOSIS — F339 Major depressive disorder, recurrent, unspecified: Secondary | ICD-10-CM | POA: Diagnosis not present

## 2018-08-14 DIAGNOSIS — M5416 Radiculopathy, lumbar region: Secondary | ICD-10-CM | POA: Diagnosis not present

## 2018-08-14 DIAGNOSIS — Z1331 Encounter for screening for depression: Secondary | ICD-10-CM | POA: Diagnosis not present

## 2018-08-14 DIAGNOSIS — M1990 Unspecified osteoarthritis, unspecified site: Secondary | ICD-10-CM | POA: Diagnosis not present

## 2018-08-14 DIAGNOSIS — N401 Enlarged prostate with lower urinary tract symptoms: Secondary | ICD-10-CM | POA: Diagnosis not present

## 2018-08-14 DIAGNOSIS — D72819 Decreased white blood cell count, unspecified: Secondary | ICD-10-CM | POA: Diagnosis not present

## 2018-08-14 DIAGNOSIS — I10 Essential (primary) hypertension: Secondary | ICD-10-CM | POA: Diagnosis not present

## 2018-08-14 DIAGNOSIS — I700 Atherosclerosis of aorta: Secondary | ICD-10-CM | POA: Diagnosis not present

## 2018-08-14 DIAGNOSIS — K219 Gastro-esophageal reflux disease without esophagitis: Secondary | ICD-10-CM | POA: Diagnosis not present

## 2018-08-14 DIAGNOSIS — C439 Malignant melanoma of skin, unspecified: Secondary | ICD-10-CM | POA: Diagnosis not present

## 2018-08-14 DIAGNOSIS — Z0000 Encounter for general adult medical examination without abnormal findings: Secondary | ICD-10-CM | POA: Diagnosis not present

## 2018-09-18 DIAGNOSIS — F419 Anxiety disorder, unspecified: Secondary | ICD-10-CM | POA: Insufficient documentation

## 2018-09-18 DIAGNOSIS — F418 Other specified anxiety disorders: Secondary | ICD-10-CM | POA: Diagnosis not present

## 2018-10-14 DIAGNOSIS — D1801 Hemangioma of skin and subcutaneous tissue: Secondary | ICD-10-CM | POA: Diagnosis not present

## 2018-10-14 DIAGNOSIS — Z85828 Personal history of other malignant neoplasm of skin: Secondary | ICD-10-CM | POA: Diagnosis not present

## 2018-10-14 DIAGNOSIS — L821 Other seborrheic keratosis: Secondary | ICD-10-CM | POA: Diagnosis not present

## 2018-10-14 DIAGNOSIS — L918 Other hypertrophic disorders of the skin: Secondary | ICD-10-CM | POA: Diagnosis not present

## 2018-10-14 DIAGNOSIS — Z85820 Personal history of malignant melanoma of skin: Secondary | ICD-10-CM | POA: Diagnosis not present

## 2018-11-06 ENCOUNTER — Other Ambulatory Visit: Payer: Self-pay | Admitting: Surgery

## 2018-11-06 DIAGNOSIS — D440 Neoplasm of uncertain behavior of thyroid gland: Secondary | ICD-10-CM

## 2018-11-20 ENCOUNTER — Ambulatory Visit
Admission: RE | Admit: 2018-11-20 | Discharge: 2018-11-20 | Disposition: A | Discharge status: Home / Self Care | Payer: Medicare Other | Source: Ambulatory Visit | Attending: Surgery | Admitting: Surgery

## 2018-11-20 DIAGNOSIS — D440 Neoplasm of uncertain behavior of thyroid gland: Secondary | ICD-10-CM

## 2018-11-20 DIAGNOSIS — E041 Nontoxic single thyroid nodule: Secondary | ICD-10-CM | POA: Diagnosis not present

## 2019-03-15 DIAGNOSIS — Z23 Encounter for immunization: Secondary | ICD-10-CM | POA: Diagnosis not present

## 2019-04-28 DIAGNOSIS — D225 Melanocytic nevi of trunk: Secondary | ICD-10-CM | POA: Diagnosis not present

## 2019-04-28 DIAGNOSIS — L821 Other seborrheic keratosis: Secondary | ICD-10-CM | POA: Diagnosis not present

## 2019-04-28 DIAGNOSIS — Z85820 Personal history of malignant melanoma of skin: Secondary | ICD-10-CM | POA: Diagnosis not present

## 2019-04-28 DIAGNOSIS — L814 Other melanin hyperpigmentation: Secondary | ICD-10-CM | POA: Diagnosis not present

## 2019-04-28 DIAGNOSIS — L918 Other hypertrophic disorders of the skin: Secondary | ICD-10-CM | POA: Diagnosis not present

## 2019-04-28 DIAGNOSIS — D1801 Hemangioma of skin and subcutaneous tissue: Secondary | ICD-10-CM | POA: Diagnosis not present

## 2019-04-28 DIAGNOSIS — Z85828 Personal history of other malignant neoplasm of skin: Secondary | ICD-10-CM | POA: Diagnosis not present

## 2019-05-31 DIAGNOSIS — Z23 Encounter for immunization: Secondary | ICD-10-CM | POA: Diagnosis not present

## 2019-06-28 DIAGNOSIS — Z23 Encounter for immunization: Secondary | ICD-10-CM | POA: Diagnosis not present

## 2019-07-24 ENCOUNTER — Emergency Department (HOSPITAL_COMMUNITY): Payer: Medicare Other

## 2019-07-24 ENCOUNTER — Encounter (HOSPITAL_COMMUNITY): Payer: Self-pay

## 2019-07-24 ENCOUNTER — Emergency Department (HOSPITAL_COMMUNITY)
Admission: EM | Admit: 2019-07-24 | Discharge: 2019-07-24 | Disposition: A | Discharge status: Home / Self Care | Payer: Medicare Other | Attending: Emergency Medicine | Admitting: Emergency Medicine

## 2019-07-24 ENCOUNTER — Other Ambulatory Visit: Payer: Self-pay

## 2019-07-24 DIAGNOSIS — Z87891 Personal history of nicotine dependence: Secondary | ICD-10-CM | POA: Insufficient documentation

## 2019-07-24 DIAGNOSIS — I10 Essential (primary) hypertension: Secondary | ICD-10-CM | POA: Diagnosis not present

## 2019-07-24 DIAGNOSIS — R197 Diarrhea, unspecified: Secondary | ICD-10-CM | POA: Insufficient documentation

## 2019-07-24 DIAGNOSIS — R52 Pain, unspecified: Secondary | ICD-10-CM | POA: Diagnosis not present

## 2019-07-24 DIAGNOSIS — Y999 Unspecified external cause status: Secondary | ICD-10-CM | POA: Insufficient documentation

## 2019-07-24 DIAGNOSIS — Y92002 Bathroom of unspecified non-institutional (private) residence single-family (private) house as the place of occurrence of the external cause: Secondary | ICD-10-CM | POA: Diagnosis not present

## 2019-07-24 DIAGNOSIS — R55 Syncope and collapse: Secondary | ICD-10-CM | POA: Diagnosis not present

## 2019-07-24 DIAGNOSIS — R112 Nausea with vomiting, unspecified: Secondary | ICD-10-CM

## 2019-07-24 DIAGNOSIS — Y9389 Activity, other specified: Secondary | ICD-10-CM | POA: Insufficient documentation

## 2019-07-24 DIAGNOSIS — S0181XA Laceration without foreign body of other part of head, initial encounter: Secondary | ICD-10-CM | POA: Diagnosis not present

## 2019-07-24 DIAGNOSIS — Z7982 Long term (current) use of aspirin: Secondary | ICD-10-CM | POA: Insufficient documentation

## 2019-07-24 DIAGNOSIS — R1033 Periumbilical pain: Secondary | ICD-10-CM | POA: Insufficient documentation

## 2019-07-24 DIAGNOSIS — S0993XA Unspecified injury of face, initial encounter: Secondary | ICD-10-CM | POA: Diagnosis present

## 2019-07-24 DIAGNOSIS — Z79899 Other long term (current) drug therapy: Secondary | ICD-10-CM | POA: Insufficient documentation

## 2019-07-24 DIAGNOSIS — S0990XA Unspecified injury of head, initial encounter: Secondary | ICD-10-CM | POA: Diagnosis not present

## 2019-07-24 DIAGNOSIS — R42 Dizziness and giddiness: Secondary | ICD-10-CM | POA: Diagnosis not present

## 2019-07-24 DIAGNOSIS — R000 Tachycardia, unspecified: Secondary | ICD-10-CM | POA: Diagnosis not present

## 2019-07-24 DIAGNOSIS — W1812XA Fall from or off toilet with subsequent striking against object, initial encounter: Secondary | ICD-10-CM | POA: Diagnosis not present

## 2019-07-24 DIAGNOSIS — M542 Cervicalgia: Secondary | ICD-10-CM | POA: Diagnosis not present

## 2019-07-24 LAB — COMPREHENSIVE METABOLIC PANEL
ALT: 26 U/L (ref 0–44)
AST: 27 U/L (ref 15–41)
Albumin: 4 g/dL (ref 3.5–5.0)
Alkaline Phosphatase: 76 U/L (ref 38–126)
Anion gap: 9 (ref 5–15)
BUN: 21 mg/dL (ref 8–23)
CO2: 26 mmol/L (ref 22–32)
Calcium: 9 mg/dL (ref 8.9–10.3)
Chloride: 106 mmol/L (ref 98–111)
Creatinine, Ser: 1.03 mg/dL (ref 0.61–1.24)
GFR calc Af Amer: >60 mL/min (ref >60)
GFR calc non Af Amer: >60 mL/min (ref >60)
Glucose, Bld: 123 mg/dL — ABNORMAL HIGH (ref 70–99)
Potassium: 4.9 mmol/L (ref 3.5–5.1)
Sodium: 141 mmol/L (ref 135–145)
Total Bilirubin: 1.9 mg/dL — ABNORMAL HIGH (ref 0.3–1.2)
Total Protein: 7 g/dL (ref 6.5–8.1)

## 2019-07-24 LAB — CBC WITH DIFFERENTIAL/PLATELET
Abs Immature Granulocytes: 0.06 10*3/uL (ref 0.00–0.07)
Basophils Absolute: 0 10*3/uL (ref 0.0–0.1)
Basophils Relative: 0 %
Eosinophils Absolute: 0 10*3/uL (ref 0.0–0.5)
Eosinophils Relative: 0 %
HCT: 48.4 % (ref 39.0–52.0)
Hemoglobin: 15.9 g/dL (ref 13.0–17.0)
Immature Granulocytes: 1 %
Lymphocytes Relative: 4 %
Lymphs Abs: 0.4 10*3/uL — ABNORMAL LOW (ref 0.7–4.0)
MCH: 32.1 pg (ref 26.0–34.0)
MCHC: 32.9 g/dL (ref 30.0–36.0)
MCV: 97.6 fL (ref 80.0–100.0)
Monocytes Absolute: 0.7 10*3/uL (ref 0.1–1.0)
Monocytes Relative: 6 %
Neutro Abs: 10.4 10*3/uL — ABNORMAL HIGH (ref 1.7–7.7)
Neutrophils Relative %: 89 %
Platelets: 186 10*3/uL (ref 150–400)
RBC: 4.96 MIL/uL (ref 4.22–5.81)
RDW: 12.8 % (ref 11.5–15.5)
WBC: 11.7 10*3/uL — ABNORMAL HIGH (ref 4.0–10.5)
nRBC: 0 % (ref 0.0–0.2)

## 2019-07-24 LAB — LIPASE, BLOOD: Lipase: 44 U/L (ref 11–51)

## 2019-07-24 MED ORDER — ONDANSETRON 4 MG PO TBDP: 4.0000 mg | ORAL_TABLET | Freq: Three times a day (TID) | ORAL | 0 refills | Status: DC | PRN

## 2019-07-24 MED ORDER — ONDANSETRON HCL 4 MG/2ML IJ SOLN
4.0000 mg | Freq: Once | INTRAMUSCULAR | Status: AC
  Administered 2019-07-24: 08:00:00 4 mg via INTRAVENOUS
  Filled 2019-07-24: qty 2

## 2019-07-24 MED ORDER — PROCHLORPERAZINE EDISYLATE 10 MG/2ML IJ SOLN
10.0000 mg | Freq: Once | INTRAMUSCULAR | Status: AC
  Administered 2019-07-24: 08:00:00 10 mg via INTRAVENOUS
  Filled 2019-07-24: qty 2

## 2019-07-24 MED ORDER — SODIUM CHLORIDE 0.9 % IV BOLUS
1000.0000 mL | Freq: Once | INTRAVENOUS | Status: AC
  Administered 2019-07-24: 1000 mL via INTRAVENOUS

## 2019-07-24 MED ORDER — MORPHINE SULFATE (PF) 4 MG/ML IV SOLN
4.0000 mg | Freq: Once | INTRAVENOUS | Status: AC
  Administered 2019-07-24: 08:00:00 4 mg via INTRAVENOUS
  Filled 2019-07-24: qty 1

## 2019-07-24 MED ORDER — IOHEXOL 300 MG/ML  SOLN
100.0000 mL | Freq: Once | INTRAMUSCULAR | Status: AC | PRN
  Administered 2019-07-24: 09:00:00 100 mL via INTRAVENOUS

## 2019-07-24 NOTE — Discharge Instructions (Addendum)
Please rest and drink plenty of fluids Take Zofran as needed for nausea You can wash the forehead but do not scrub the area or put any lotion or ointment where the glue is. The glue will flake off in about one week Please return if you are worsening

## 2019-07-24 NOTE — ED Notes (Signed)
Pt sitting up in bed, C-collar off, drink and graham crackers at bedside.

## 2019-07-24 NOTE — ED Notes (Signed)
Son Corene Cornea would like an update. [phone]

## 2019-07-24 NOTE — ED Provider Notes (Signed)
[facility] EMERGENCY DEPARTMENT Provider Note   CSN: [id number] Arrival date & time: 07/24/19  [id number]     History Chief Complaint  Patient presents with  . Diarrhea  . Loss of Consciousness    Brent Hanna is a 79 y.o. male with history of hypertension and GERD who presents with syncopal episode and diarrhea.  Patient states that he woke up in the middle of the night and felt like he had to go the bathroom.  He started to have a large amount of diarrhea.  He was sitting on the toilet and he states the next thing he knows he was on the ground and he had hit his head and it was bleeding.  He managed to call EMS and they were able to get him up to the stretcher.  He states that he is having some posterior neck pain and some mild periumbilical abdominal pain with nausea.  He has had several episodes of vomiting and received Zofran by EMS.  He denies headache, dizziness, vision changes, chest pain, shortness of breath, cough, urinary symptoms, upper or lower extremity pain.  He is not on blood thinners.  Past surgical history significant for TURP.  Per chart review he has had colonoscopy in the past which showed polyps.  [facility] He has had 2 COVID vaccines. He is UTD on tetanus.  HPI     Past Medical History:  Diagnosis Date  . Arthritis   . BPH (benign prostatic hyperplasia)   . GERD (gastroesophageal reflux disease)    occasionally   . History of kidney stones   . Hypertension   . Pain    BACK PAIN - HAD BACK SURG OCT 2014 - STILL A LOT OF PAIN    Patient Active Problem List   Diagnosis Date Noted  . Hematuria, gross 08/04/2013  . Syncope 08/03/2013  . Benign prostate hyperplasia 08/02/2013    Past Surgical History:  Procedure Laterality Date  . APPENDECTOMY    . COLONOSCOPY W/ BIOPSIES AND POLYPECTOMY  2013  . EXTRACORPOREAL SHOCK WAVE LITHOTRIPSY Left 09/11/2017   Procedure: LEFT EXTRACORPOREAL SHOCK WAVE LITHOTRIPSY ([facility]);   Surgeon: Ceasar Mons, MD;  Location: [facility];  Service: Urology;  Laterality: Left;  . LAMINECTOMY WITH POSTERIOR LATERAL ARTHRODESIS LEVEL 1 Left 03/24/2013   Procedure: LEFT LUMBAR FIVE-SACRAL ONE LUMBAR LAMINECTOMY,FACETECTOMY,FORAMINOTOMY AND POSSIBLE MICRODISKECTOMY,POSTERIOR LATERAL ARTHRODESIS;  Surgeon: Hosie Spangle, MD;  Location: [facility];  Service: Neurosurgery;  Laterality: Left;  left  . NECK SURGERY Left 1978   piece of metal removed  . TONSILLECTOMY    . TRANSURETHRAL RESECTION OF PROSTATE N/A 08/02/2013   Procedure: TRANSURETHRAL RESECTION OF THE PROSTATE WITH GYRUS INSTRUMENTS;  Surgeon: Ailene Rud, MD;  Location: [facility];  Service: Urology;  Laterality: N/A;       No family history on file.  Social History   Tobacco Use  . Smoking status: Former Smoker    Packs/day: 1.00    Years: 10.00    Pack years: 10.00    Types: Cigarettes  . Smokeless tobacco: Never Used  Substance Use Topics  . Alcohol use: Yes    Comment: occasionally ALCOHOL  -  QUIT SMOKING WHEN 4O SOMETHING YRS OLD  . Drug use: No    Home Medications Prior to Admission medications   Medication Sig Start Date End Date Taking? Authorizing Provider  aspirin EC 81 MG tablet Take 81 mg by mouth daily.  [provider]  ciprofloxacin (CIPRO) 500 MG tablet Take 1 tablet (500 mg total) by mouth 2 (two) times daily. 08/05/13   Carolan Clines, MD  losartan (COZAAR) 50 MG tablet Take 25 mg by mouth daily.     [provider]  omeprazole (PRILOSEC OTC) 20 MG tablet Take 20 mg by mouth every other day.     [provider]  oxyCODONE-acetaminophen (ROXICET) 5-325 MG per tablet Take 1 tablet by mouth every 4 (four) hours as needed for severe pain. 08/05/13   Carolan Clines, MD    Allergies    Patient has no known allergies.  Review of Systems   Review of Systems  Constitutional: Negative for chills and fever.  Respiratory: Negative for cough  and shortness of breath.   Cardiovascular: Negative for chest pain.  Gastrointestinal: Positive for abdominal pain, diarrhea, nausea and vomiting.  Genitourinary: Negative for difficulty urinating and dysuria.  Musculoskeletal: Positive for neck pain.  Skin: Positive for wound.  Neurological: Positive for syncope. Negative for dizziness, weakness, numbness and headaches.  All other systems reviewed and are negative.   Physical Exam Updated Vital Signs BP 137/75   Pulse 85   Resp 20   Ht 5\' 9"  (1.753 m)   Wt 84.6 kg   SpO2 98%   BMI 27.54 kg/m   Physical Exam Vitals and nursing note reviewed.  Constitutional:      General: He is not in acute distress.    Appearance: Normal appearance. He is well-developed. He is not ill-appearing.  HENT:     Head: Normocephalic.     Comments: Jagged ~2cm laceration above the L eyebrow Eyes:     General: No scleral icterus.       Right eye: No discharge.        Left eye: No discharge.     Conjunctiva/sclera: Conjunctivae normal.     Pupils: Pupils are equal, round, and reactive to light.  Cardiovascular:     Rate and Rhythm: Normal rate and regular rhythm.  Pulmonary:     Effort: Pulmonary effort is normal. No respiratory distress.     Breath sounds: Normal breath sounds.  Abdominal:     General: There is no distension.     Palpations: Abdomen is soft.     Tenderness: There is abdominal tenderness (mild periumbilical tenderness).  Musculoskeletal:     Cervical back: Normal range of motion.  Skin:    General: Skin is warm and dry.  Neurological:     Mental Status: He is alert and oriented to person, place, and time.  Psychiatric:        Behavior: Behavior normal.     ED Results / Procedures / Treatments   Labs (all labs ordered are listed, but only abnormal results are displayed) Labs Reviewed  CBC WITH DIFFERENTIAL/PLATELET - Abnormal; Notable for the following components:      Result Value   WBC 11.7 (*)    Neutro Abs 10.4  (*)    Lymphs Abs 0.4 (*)    All other components within normal limits  COMPREHENSIVE METABOLIC PANEL - Abnormal; Notable for the following components:   Glucose, Bld 123 (*)    Total Bilirubin 1.9 (*)    All other components within normal limits  LIPASE, BLOOD  URINALYSIS, ROUTINE W REFLEX MICROSCOPIC    EKG EKG Interpretation  Date/Time:  Saturday July 24 2019 06:38:08 EST Ventricular Rate:  83 PR Interval:    QRS Duration: 99 QT Interval:  383  QTC Calculation: 450 R Axis:   56 Text Interpretation: Sinus rhythm Left atrial enlargement Anteroseptal infarct, age indeterminate No acute changes Confirmed by Addison Lank [phone]) on 07/24/2019 6:44:31 AM   Radiology CT Head Wo Contrast  Result Date: 07/24/2019 CLINICAL DATA:  79 year old male with a history of fall EXAM: CT HEAD WITHOUT CONTRAST CT CERVICAL SPINE WITHOUT CONTRAST TECHNIQUE: Multidetector CT imaging of the head and cervical spine was performed following the standard protocol without intravenous contrast. Multiplanar CT image reconstructions of the cervical spine were also generated. COMPARISON:  None. FINDINGS: CT HEAD FINDINGS Brain: No acute intracranial hemorrhage. No midline shift or mass effect. Gray-white differentiation maintained. Unremarkable appearance of the ventricular system. Vascular: Calcification Skull: Subcutaneous gas in the left supraorbital region with mild soft tissue swelling compatible given history of laceration. No underlying fracture. Sinuses/Orbits: Unremarkable appearance of the orbits. Mastoid air cells clear. No middle ear effusion. No significant sinus disease. Other: None CT CERVICAL SPINE FINDINGS Alignment: Reversal of the normal cervical lordosis, with mild kyphotic curvature at C5-C6. Vertebral bodies and facets maintain alignment with no subluxation. Skull base and vertebrae: No acute fracture at the skullbase. Vertebral body heights relatively maintained. No acute fracture identified.  Soft tissues and spinal canal: Unremarkable cervical soft tissues. Lymph nodes are present, though not enlarged. Disc levels: Disc space narrowing with endplate changes and anterior osteophyte formation at C5-C6 and C6-C7. Right-sided uncovertebral joint disease at C5-C6. No bony canal narrowing. Upper chest: Unremarkable appearance of the lung apices. Other: No bony canal narrowing. IMPRESSION: Head CT: No acute intracranial abnormality. Left supraorbital soft tissue injury compatible given history. Cervical CT: No acute fracture or malalignment of the cervical spine. Electronically Signed   By: Corrie Mckusick D.O.   On: 07/24/2019 09:47   CT Cervical Spine Wo Contrast  Result Date: 07/24/2019 CLINICAL DATA:  79 year old male with a history of fall EXAM: CT HEAD WITHOUT CONTRAST CT CERVICAL SPINE WITHOUT CONTRAST TECHNIQUE: Multidetector CT imaging of the head and cervical spine was performed following the standard protocol without intravenous contrast. Multiplanar CT image reconstructions of the cervical spine were also generated. COMPARISON:  None. FINDINGS: CT HEAD FINDINGS Brain: No acute intracranial hemorrhage. No midline shift or mass effect. Gray-white differentiation maintained. Unremarkable appearance of the ventricular system. Vascular: Calcification Skull: Subcutaneous gas in the left supraorbital region with mild soft tissue swelling compatible given history of laceration. No underlying fracture. Sinuses/Orbits: Unremarkable appearance of the orbits. Mastoid air cells clear. No middle ear effusion. No significant sinus disease. Other: None CT CERVICAL SPINE FINDINGS Alignment: Reversal of the normal cervical lordosis, with mild kyphotic curvature at C5-C6. Vertebral bodies and facets maintain alignment with no subluxation. Skull base and vertebrae: No acute fracture at the skullbase. Vertebral body heights relatively maintained. No acute fracture identified. Soft tissues and spinal canal:  Unremarkable cervical soft tissues. Lymph nodes are present, though not enlarged. Disc levels: Disc space narrowing with endplate changes and anterior osteophyte formation at C5-C6 and C6-C7. Right-sided uncovertebral joint disease at C5-C6. No bony canal narrowing. Upper chest: Unremarkable appearance of the lung apices. Other: No bony canal narrowing. IMPRESSION: Head CT: No acute intracranial abnormality. Left supraorbital soft tissue injury compatible given history. Cervical CT: No acute fracture or malalignment of the cervical spine. Electronically Signed   By: Corrie Mckusick D.O.   On: 07/24/2019 09:47    Procedures .Marland KitchenLaceration Repair  Date/Time: 07/24/2019 10:03 AM Performed by: Recardo Evangelist, PA-C Authorized by: Recardo Evangelist, PA-C  Consent:    Consent obtained:  Verbal   Consent given by:  Patient   Risks discussed:  Infection, pain and poor cosmetic result   Alternatives discussed:  No treatment Anesthesia (see MAR for exact dosages):    Anesthesia method:  None Laceration details:    Location:  Face   Face location:  Forehead   Length (cm):  2   Depth (mm):  2 Repair type:    Repair type:  Simple Pre-procedure details:    Preparation:  Patient was prepped and draped in usual sterile fashion Exploration:    Wound exploration: wound explored through full range of motion and entire depth of wound probed and visualized     Wound extent: no underlying fracture noted   Treatment:    Area cleansed with:  Saline   Amount of cleaning:  Standard   Irrigation method:  Tap   Visualized foreign bodies/material removed: no   Skin repair:    Repair method:  Tissue adhesive Approximation:    Approximation:  Close Post-procedure details:    Dressing:  Open (no dressing)   Patient tolerance of procedure:  Tolerated well, no immediate complications   (including critical care time)   Medications Ordered in ED Medications  ondansetron (ZOFRAN) injection 4 mg (4 mg  Intravenous Given 07/24/19 0732)  morphine 4 MG/ML injection 4 mg (4 mg Intravenous Given 07/24/19 0811)  prochlorperazine (COMPAZINE) injection 10 mg (10 mg Intravenous Given 07/24/19 0816)  iohexol (OMNIPAQUE) 300 MG/ML solution 100 mL (100 mLs Intravenous Contrast Given 07/24/19 0919)  sodium chloride 0.9 % bolus 1,000 mL (0 mLs Intravenous Stopped 07/24/19 1338)    ED Course  I have reviewed the triage vital signs and the nursing notes.  Pertinent labs & imaging results that were available during my care of the patient were reviewed by me and considered in my medical decision making (see chart for details).  Clinical Course as of Jul 23 1000  Sat Feb 27, 235  3813 79 year old male.  He is complaining of acute onset of diarrhea and crampy abdominal pain that started around 3 AM this morning.  Episode of syncope off of the toilet striking forehead and sustaining a laceration.  Complaining of neck pain and crampy abdominal pain.  No numbness or weakness.  Getting imaging lab work, medications   [MB]    Clinical Course User Index [MB] Hayden Rasmussen, MD   79 year old male presents with nausea, vomiting, diarrhea and syncopal episode while on the toilet having diarrhea.  EKG is sinus rhythm and appears unchanged.  On exam he has a jagged laceration above the left eyebrow but no other signs of trauma.  He is complaining of neck pain and mild abdominal pain.  Will obtain labs, CT head, C-spine, abdomen and pelvis.  Will give additional dose of antiemetic and pain medicine  CBC shows mild leukocytosis of 11.  CMP and lipase are normal.   10AM Feels better. Forehead lac was cleaned and repaired with dermabond. CT head, C-spine, abdomen/pelvis are negative. Will po challenge and ambulate.  11:38 AM Pt tolerated PO. Rechecked him and he states he feels "woozy". He is mildly tachycardic. Will give a fluid bolus and have nurses ambulate  Patient was able to ambulate and urinate.  Will not wait on  UA as he has had no urinary symptoms.  Discussed with patient's son Corene Cornea who verbalized understanding of plan.  Wound care was discussed with the patient and prescription for Zofran was sent.  Advised return if worsening.  MDM Rules/Calculators/A&P                       Final Clinical Impression(s) / ED Diagnoses Final diagnoses:  Nausea vomiting and diarrhea  Syncope and collapse  Laceration of forehead, initial encounter    Rx / DC Orders ED Discharge Orders    None       Recardo Evangelist, PA-C 07/24/19 1436    Hayden Rasmussen, MD 07/24/19 1700

## 2019-07-24 NOTE — ED Notes (Signed)
Dermabond at bedside per EDP order.

## 2019-07-24 NOTE — ED Triage Notes (Signed)
Patient arrived from [facility] with a chief complaint of syncope that started at 0500. Pt reports sitting on the toilet, and then the next thing he realized he was lying on the ground. 1 inch laceration noted above the left eyebrow, bleeding controlled by EMS. Complains of neck pain. Rigid c-collar applied by EMS.

## 2019-08-24 DIAGNOSIS — I10 Essential (primary) hypertension: Secondary | ICD-10-CM | POA: Diagnosis not present

## 2019-08-24 DIAGNOSIS — Z125 Encounter for screening for malignant neoplasm of prostate: Secondary | ICD-10-CM | POA: Diagnosis not present

## 2019-08-24 DIAGNOSIS — E041 Nontoxic single thyroid nodule: Secondary | ICD-10-CM | POA: Diagnosis not present

## 2019-08-30 DIAGNOSIS — Z1212 Encounter for screening for malignant neoplasm of rectum: Secondary | ICD-10-CM | POA: Diagnosis not present

## 2019-08-30 DIAGNOSIS — R809 Proteinuria, unspecified: Secondary | ICD-10-CM | POA: Diagnosis not present

## 2019-08-30 DIAGNOSIS — R82998 Other abnormal findings in urine: Secondary | ICD-10-CM | POA: Diagnosis not present

## 2019-08-31 DIAGNOSIS — C439 Malignant melanoma of skin, unspecified: Secondary | ICD-10-CM | POA: Diagnosis not present

## 2019-08-31 DIAGNOSIS — I700 Atherosclerosis of aorta: Secondary | ICD-10-CM | POA: Diagnosis not present

## 2019-08-31 DIAGNOSIS — R809 Proteinuria, unspecified: Secondary | ICD-10-CM | POA: Diagnosis not present

## 2019-08-31 DIAGNOSIS — F418 Other specified anxiety disorders: Secondary | ICD-10-CM | POA: Diagnosis not present

## 2019-08-31 DIAGNOSIS — Z0000 Encounter for general adult medical examination without abnormal findings: Secondary | ICD-10-CM | POA: Diagnosis not present

## 2019-08-31 DIAGNOSIS — K219 Gastro-esophageal reflux disease without esophagitis: Secondary | ICD-10-CM | POA: Diagnosis not present

## 2019-08-31 DIAGNOSIS — N401 Enlarged prostate with lower urinary tract symptoms: Secondary | ICD-10-CM | POA: Diagnosis not present

## 2019-08-31 DIAGNOSIS — F339 Major depressive disorder, recurrent, unspecified: Secondary | ICD-10-CM | POA: Diagnosis not present

## 2019-08-31 DIAGNOSIS — R55 Syncope and collapse: Secondary | ICD-10-CM | POA: Diagnosis not present

## 2019-08-31 DIAGNOSIS — Z1331 Encounter for screening for depression: Secondary | ICD-10-CM | POA: Diagnosis not present

## 2019-08-31 DIAGNOSIS — J029 Acute pharyngitis, unspecified: Secondary | ICD-10-CM | POA: Diagnosis not present

## 2019-08-31 DIAGNOSIS — I10 Essential (primary) hypertension: Secondary | ICD-10-CM | POA: Diagnosis not present

## 2019-08-31 DIAGNOSIS — R3121 Asymptomatic microscopic hematuria: Secondary | ICD-10-CM | POA: Diagnosis not present

## 2019-09-02 DIAGNOSIS — F329 Major depressive disorder, single episode, unspecified: Secondary | ICD-10-CM | POA: Insufficient documentation

## 2019-09-02 DIAGNOSIS — R200 Anesthesia of skin: Secondary | ICD-10-CM | POA: Insufficient documentation

## 2019-09-02 DIAGNOSIS — R531 Weakness: Secondary | ICD-10-CM | POA: Diagnosis not present

## 2019-09-02 DIAGNOSIS — I10 Essential (primary) hypertension: Secondary | ICD-10-CM | POA: Diagnosis not present

## 2019-09-09 DIAGNOSIS — M48061 Spinal stenosis, lumbar region without neurogenic claudication: Secondary | ICD-10-CM | POA: Diagnosis not present

## 2019-09-09 DIAGNOSIS — G629 Polyneuropathy, unspecified: Secondary | ICD-10-CM | POA: Diagnosis not present

## 2019-09-10 ENCOUNTER — Other Ambulatory Visit: Payer: Self-pay | Admitting: Neurosurgery

## 2019-09-10 DIAGNOSIS — M48061 Spinal stenosis, lumbar region without neurogenic claudication: Secondary | ICD-10-CM

## 2019-09-15 ENCOUNTER — Other Ambulatory Visit: Payer: Self-pay

## 2019-09-15 ENCOUNTER — Ambulatory Visit
Admission: RE | Admit: 2019-09-15 | Discharge: 2019-09-15 | Disposition: A | Discharge status: Home / Self Care | Payer: Medicare Other | Source: Ambulatory Visit | Attending: Neurosurgery | Admitting: Neurosurgery

## 2019-09-15 DIAGNOSIS — M48061 Spinal stenosis, lumbar region without neurogenic claudication: Secondary | ICD-10-CM

## 2019-09-15 DIAGNOSIS — R200 Anesthesia of skin: Secondary | ICD-10-CM | POA: Diagnosis not present

## 2019-09-16 DIAGNOSIS — M5416 Radiculopathy, lumbar region: Secondary | ICD-10-CM | POA: Diagnosis not present

## 2019-09-16 DIAGNOSIS — R200 Anesthesia of skin: Secondary | ICD-10-CM | POA: Diagnosis not present

## 2019-10-01 DIAGNOSIS — G629 Polyneuropathy, unspecified: Secondary | ICD-10-CM | POA: Diagnosis not present

## 2019-10-01 DIAGNOSIS — M4727 Other spondylosis with radiculopathy, lumbosacral region: Secondary | ICD-10-CM | POA: Diagnosis not present

## 2019-10-26 DIAGNOSIS — N529 Male erectile dysfunction, unspecified: Secondary | ICD-10-CM | POA: Diagnosis not present

## 2019-10-26 DIAGNOSIS — Z1152 Encounter for screening for COVID-19: Secondary | ICD-10-CM | POA: Insufficient documentation

## 2019-10-26 DIAGNOSIS — R059 Cough, unspecified: Secondary | ICD-10-CM | POA: Insufficient documentation

## 2019-10-26 DIAGNOSIS — R05 Cough: Secondary | ICD-10-CM | POA: Diagnosis not present

## 2019-10-26 DIAGNOSIS — I10 Essential (primary) hypertension: Secondary | ICD-10-CM | POA: Diagnosis not present

## 2019-10-26 DIAGNOSIS — B349 Viral infection, unspecified: Secondary | ICD-10-CM | POA: Diagnosis not present

## 2019-12-14 DIAGNOSIS — M5417 Radiculopathy, lumbosacral region: Secondary | ICD-10-CM | POA: Diagnosis not present

## 2019-12-14 DIAGNOSIS — M4727 Other spondylosis with radiculopathy, lumbosacral region: Secondary | ICD-10-CM | POA: Insufficient documentation

## 2020-02-01 DIAGNOSIS — M5416 Radiculopathy, lumbar region: Secondary | ICD-10-CM | POA: Diagnosis not present

## 2020-02-01 DIAGNOSIS — G629 Polyneuropathy, unspecified: Secondary | ICD-10-CM | POA: Diagnosis not present

## 2020-02-24 ENCOUNTER — Other Ambulatory Visit: Payer: Self-pay | Admitting: Student

## 2020-02-24 ENCOUNTER — Telehealth: Payer: Self-pay | Admitting: Nurse Practitioner

## 2020-02-24 DIAGNOSIS — Z6826 Body mass index (BMI) 26.0-26.9, adult: Secondary | ICD-10-CM | POA: Insufficient documentation

## 2020-02-24 DIAGNOSIS — I10 Essential (primary) hypertension: Secondary | ICD-10-CM | POA: Diagnosis not present

## 2020-02-24 DIAGNOSIS — M5416 Radiculopathy, lumbar region: Secondary | ICD-10-CM

## 2020-02-24 NOTE — Telephone Encounter (Signed)
Phone call to patient to verify medication list and allergies for myelogram procedure. Pt aware he will not need to hold any medications for this procedure. Pre and post procedure instructions reviewed with pt. Pt verbalized understanding.  

## 2020-02-29 ENCOUNTER — Other Ambulatory Visit: Payer: Self-pay

## 2020-02-29 ENCOUNTER — Ambulatory Visit
Admission: RE | Admit: 2020-02-29 | Discharge: 2020-02-29 | Disposition: A | Discharge status: Home / Self Care | Payer: Medicare Other | Source: Ambulatory Visit | Attending: Student | Admitting: Student

## 2020-02-29 DIAGNOSIS — M5416 Radiculopathy, lumbar region: Secondary | ICD-10-CM

## 2020-02-29 DIAGNOSIS — I700 Atherosclerosis of aorta: Secondary | ICD-10-CM | POA: Diagnosis not present

## 2020-02-29 DIAGNOSIS — M5126 Other intervertebral disc displacement, lumbar region: Secondary | ICD-10-CM | POA: Diagnosis not present

## 2020-02-29 DIAGNOSIS — M4326 Fusion of spine, lumbar region: Secondary | ICD-10-CM | POA: Diagnosis not present

## 2020-02-29 MED ORDER — DIAZEPAM 5 MG PO TABS
5.0000 mg | ORAL_TABLET | Freq: Once | ORAL | Status: AC
  Administered 2020-02-29: 5 mg via ORAL

## 2020-02-29 MED ORDER — IOPAMIDOL (ISOVUE-M 200) INJECTION 41%
18.0000 mL | Freq: Once | INTRAMUSCULAR | Status: AC
  Administered 2020-02-29: 18 mL via INTRATHECAL

## 2020-02-29 NOTE — Discharge Instructions (Signed)

## 2020-03-02 DIAGNOSIS — M5416 Radiculopathy, lumbar region: Secondary | ICD-10-CM | POA: Diagnosis not present

## 2020-03-02 DIAGNOSIS — Z6826 Body mass index (BMI) 26.0-26.9, adult: Secondary | ICD-10-CM | POA: Diagnosis not present

## 2020-03-02 DIAGNOSIS — I10 Essential (primary) hypertension: Secondary | ICD-10-CM | POA: Diagnosis not present

## 2020-03-03 DIAGNOSIS — N529 Male erectile dysfunction, unspecified: Secondary | ICD-10-CM | POA: Diagnosis not present

## 2020-03-03 DIAGNOSIS — K219 Gastro-esophageal reflux disease without esophagitis: Secondary | ICD-10-CM | POA: Diagnosis not present

## 2020-03-03 DIAGNOSIS — I10 Essential (primary) hypertension: Secondary | ICD-10-CM | POA: Diagnosis not present

## 2020-03-03 DIAGNOSIS — Z23 Encounter for immunization: Secondary | ICD-10-CM | POA: Diagnosis not present

## 2020-03-03 DIAGNOSIS — F329 Major depressive disorder, single episode, unspecified: Secondary | ICD-10-CM | POA: Diagnosis not present

## 2020-03-03 DIAGNOSIS — M5416 Radiculopathy, lumbar region: Secondary | ICD-10-CM | POA: Diagnosis not present

## 2020-03-03 DIAGNOSIS — I493 Ventricular premature depolarization: Secondary | ICD-10-CM | POA: Diagnosis not present

## 2020-03-03 DIAGNOSIS — I6529 Occlusion and stenosis of unspecified carotid artery: Secondary | ICD-10-CM | POA: Diagnosis not present

## 2020-03-03 DIAGNOSIS — I700 Atherosclerosis of aorta: Secondary | ICD-10-CM | POA: Diagnosis not present

## 2020-03-03 DIAGNOSIS — N401 Enlarged prostate with lower urinary tract symptoms: Secondary | ICD-10-CM | POA: Diagnosis not present

## 2020-03-09 ENCOUNTER — Other Ambulatory Visit: Payer: Self-pay | Admitting: Neurosurgery

## 2020-03-09 ENCOUNTER — Other Ambulatory Visit: Payer: Self-pay

## 2020-03-17 ENCOUNTER — Other Ambulatory Visit: Payer: Self-pay

## 2020-03-28 ENCOUNTER — Ambulatory Visit (INDEPENDENT_AMBULATORY_CARE_PROVIDER_SITE_OTHER): Payer: Medicare Other | Admitting: Vascular Surgery

## 2020-03-28 ENCOUNTER — Encounter: Payer: Self-pay | Admitting: Vascular Surgery

## 2020-03-28 ENCOUNTER — Other Ambulatory Visit: Payer: Self-pay

## 2020-03-28 DIAGNOSIS — M5442 Lumbago with sciatica, left side: Secondary | ICD-10-CM

## 2020-03-28 DIAGNOSIS — G8929 Other chronic pain: Secondary | ICD-10-CM | POA: Insufficient documentation

## 2020-03-28 NOTE — Progress Notes (Signed)
Patient name: Brent Hanna MRN: [id number] DOB: August 04, 1940 Sex: male  REASON FOR CONSULT: Evaluate for L5-S1 ALIF  HPI: Brent Hanna is a 79 y.o. male, with history of hypertension and chronic back pain who presents for preop evaluation of planned L5-S1 ALIF.  Patient previously had a posterior L5-S1 instrumentation in 2014 by Dr. Sherwood Gambler.  He states in about April of this year he fell and started having back pain with radiculopathy down the left leg.  He has numbness in the left leg particularly into the lateral calf and bottom of the foot.  He has been evaluated by Dr. Saintclair Halsted with neurosurgery who has done extensive work-up and recommended L5-S1 anterior approach with ALIF.  He has failed conservative management with physical therapy and steroid injections etc.  Previous abdominal surgery includes appendectomy when he was a child.  He does not smoke.  Past Medical History:  Diagnosis Date  . Arthritis   . BPH (benign prostatic hyperplasia)   . GERD (gastroesophageal reflux disease)    occasionally   . History of kidney stones   . Hypertension   . Mild back pain   . Numbness and tingling   . Pain    BACK PAIN - HAD BACK SURG OCT 2014 - STILL A LOT OF PAIN  . Pain in left shin    Lateral radiating to the bottom of left foot.  . 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    Past Surgical History:  Procedure Laterality Date  . APPENDECTOMY    . COLONOSCOPY W/ BIOPSIES AND POLYPECTOMY  2013  . EXTRACORPOREAL SHOCK WAVE LITHOTRIPSY Left 09/11/2017   Procedure: LEFT EXTRACORPOREAL SHOCK WAVE LITHOTRIPSY (ESWL);  Surgeon: Ceasar Mons, MD;  Location: [facility];  Service: Urology;  Laterality: Left;  . LAMINECTOMY WITH POSTERIOR LATERAL ARTHRODESIS LEVEL 1 Left 03/24/2013   Procedure: LEFT LUMBAR FIVE-SACRAL ONE LUMBAR LAMINECTOMY,FACETECTOMY,FORAMINOTOMY AND POSSIBLE MICRODISKECTOMY,POSTERIOR LATERAL ARTHRODESIS;  Surgeon:  Hosie Spangle, MD;  Location: [facility];  Service: Neurosurgery;  Laterality: Left;  left  . NECK SURGERY Left 1978   piece of metal removed  . TONSILLECTOMY    . TRANSURETHRAL RESECTION OF PROSTATE N/A 08/02/2013   Procedure: TRANSURETHRAL RESECTION OF THE PROSTATE WITH GYRUS INSTRUMENTS;  Surgeon: Ailene Rud, MD;  Location: [facility];  Service: Urology;  Laterality: N/A;    History reviewed. No pertinent family history.  SOCIAL HISTORY: Social History   Socioeconomic History  . Marital status: Widowed    Spouse name: Not on file  . Number of children: Not on file  . Years of education: Not on file  . Highest education level: Not on file  Occupational History  . Not on file  Tobacco Use  . Smoking status: Former Smoker    Packs/day: 1.00    Years: 10.00    Pack years: 10.00    Types: Cigarettes  . Smokeless tobacco: Never Used  Vaping Use  . Vaping Use: Never used  Substance and Sexual Activity  . Alcohol use: Yes    Comment: occasionally ALCOHOL  -  QUIT SMOKING WHEN 4O SOM[age] YRS OLD  . Drug use: No  . Sexual activity: Not on file  Other Topics Concern  . Not on file  Social History Narrative  . Not on file   Social Determinants of Health   Financial Resource Strain:   . Difficulty of Paying Living Expenses: Not on file  Food Insecurity:   .  Worried About Charity fundraiser in the Last Year: Not on file  . Ran Out of Food in the Last Year: Not on file  Transportation Needs:   . Lack of Transportation (Medical): Not on file  . Lack of Transportation (Non-Medical): Not on file  Physical Activity:   . Days of Exercise per Week: Not on file  . Minutes of Exercise per Session: Not on file  Stress:   . Feeling of Stress : Not on file  Social Connections:   . Frequency of Communication with Friends and Family: Not on file  . Frequency of Social Gatherings with Friends and Family: Not on file  . Attends Religious Services: Not on file  . Active  Member of Clubs or Organizations: Not on file  . Attends Archivist Meetings: Not on file  . Marital Status: Not on file  Intimate Partner Violence:   . Fear of Current or Ex-Partner: Not on file  . Emotionally Abused: Not on file  . Physically Abused: Not on file  . Sexually Abused: Not on file    No Known Allergies  Current Outpatient Medications  Medication Sig Dispense Refill  . losartan (COZAAR) 50 MG tablet Take 25 mg by mouth daily.      No current facility-administered medications for this visit.    REVIEW OF SYSTEMS:  [X]  denotes positive finding, [ ]  denotes negative finding Cardiac  Comments:  Chest pain or chest pressure:    Shortness of breath upon exertion:    Short of breath when lying flat:    Irregular heart rhythm:        Vascular    Pain in calf, thigh, or hip brought on by ambulation:    Pain in feet at night that wakes you up from your sleep:     Blood clot in your veins:    Leg swelling:         Pulmonary    Oxygen at home:    Productive cough:     Wheezing:         Neurologic    Sudden weakness in arms or legs:     Sudden numbness in arms or legs:  x Left foot  Sudden onset of difficulty speaking or slurred speech:    Temporary loss of vision in one eye:     Problems with dizziness:         Gastrointestinal    Blood in stool:     Vomited blood:         Genitourinary    Burning when urinating:     Blood in urine:        Psychiatric    Major depression:         Hematologic    Bleeding problems:    Problems with blood clotting too easily:        Skin    Rashes or ulcers:        Constitutional    Fever or chills:      PHYSICAL EXAM: Vitals:   03/28/20 0833  BP: (!) 183/92  Pulse: 75  Resp: 20  Temp: 98.4 F (36.9 C)  SpO2: 98%  Weight: 186 lb (84.4 kg)  Height: 5\' 9"  (1.753 m)    GENERAL: The patient is a well-nourished male, in no acute distress. The vital signs are documented above. CARDIAC: There is a  regular rate and rhythm.  VASCULAR:  Palpable femoral pulses bilaterally Palpable dorsalis pedis pulses bilaterally No lower  extremity tissue loss PULMONARY: No respiratory distress. ABDOMEN: Soft and non-tender.  Unable to visualize prior scar from appendectomy.   MUSCULOSKELETAL: There are no major deformities or cyanosis. NEUROLOGIC: No focal weakness or paresthesias are detected. SKIN: There are no ulcers or rashes noted. PSYCHIATRIC: The patient has a normal affect.  DATA:   I independently reviewed his CT lumbar spine from 02/29/2020 and he has a moderately calcified aortoiliac segment.  Prior posterior instrumentation at L5-S1.  Assessment/Plan:  79 year old male with chronic back pain and radiculopathy that presents for preop evaluation of planned L5-S1 ALIF.  After review of patient's recent CT lumbar spine from 02/29/2020 and examining his abdomen I think he would be an excellent candidate for anterior approach.  We talked about transverse incision over the left rectus muscle with entering the retroperitoneal space lateral and mobilizing the peritoneum and intestines as well as the left ureter across midline as well as left mobilizing the left iliac artery and vein.  We talked about risk of injury to the above structures.  Look forward to assisting Dr. Saintclair Halsted and patient is scheduled for 11/22.   Marty Heck, MD [facility] Office: [phone]

## 2020-04-04 DIAGNOSIS — Z23 Encounter for immunization: Secondary | ICD-10-CM | POA: Diagnosis not present

## 2020-04-12 NOTE — Pre-Procedure Instructions (Signed)
ANTHANY THORNHILL  04/12/2020      [id number] [address], [address] AT [address] [place] [address] Phone: [phone] Fax: [phone]    Your procedure is scheduled on Nov. 22  Report to [facility] Entrance A at 5:30 A.M.  Call this number if you have problems the morning of surgery:  [phone]   Remember:  Do not eat or drink after midnight.      Take these medicines the morning of surgery with A SIP OF WATER :  None              7 days prior to surgery STOP taking any Aspirin (unless otherwise instructed by your surgeon), Aleve, Naproxen, Ibuprofen, Motrin, Advil, Goody's, BC's, all herbal medications, fish oil, and all vitamins.    Do not wear jewelry.  Do not wear lotions, powders, or perfumes, or deodorant.  Do not shave 48 hours prior to surgery.  Men may shave face and neck.  Do not bring valuables to the hospital.  [facility] is not responsible for any belongings or valuables.  Contacts, dentures or bridgework may not be worn into surgery.  Leave your suitcase in the car.  After surgery it may be brought to your room.  For patients admitted to the hospital, discharge time will be determined by your treatment team.  Patients discharged the day of surgery will not be allowed to drive home.    Special instructions:  [facility]- Preparing For Surgery  Before surgery, you can play an important role. Because skin is not sterile, your skin needs to be as free of germs as possible. You can reduce the number of germs on your skin by washing with CHG (chlorahexidine gluconate) Soap before surgery.  CHG is an antiseptic cleaner which kills germs and bonds with the skin to continue killing germs even after washing.    Oral Hygiene is also important to reduce your risk of infection.  Remember - BRUSH YOUR TEETH THE MORNING OF SURGERY WITH YOUR REGULAR TOOTHPASTE  Please do not use if you have  an allergy to CHG or antibacterial soaps. If your skin becomes reddened/irritated stop using the CHG.  Do not shave (including legs and underarms) for at least 48 hours prior to first CHG shower. It is OK to shave your face.  Please follow these instructions carefully.   1. Shower the NIGHT BEFORE SURGERY and the MORNING OF SURGERY with CHG.   2. If you chose to wash your hair, wash your hair first as usual with your normal shampoo.  3. After you shampoo, rinse your hair and body thoroughly to remove the shampoo.  4. Use CHG as you would any other liquid soap. You can apply CHG directly to the skin and wash gently with a scrungie or a clean washcloth.   5. Apply the CHG Soap to your body ONLY FROM THE NECK DOWN.  Do not use on open wounds or open sores. Avoid contact with your eyes, ears, mouth and genitals (private parts). Wash Face and genitals (private parts)  with your normal soap.  6. Wash thoroughly, paying special attention to the area where your surgery will be performed.  7. Thoroughly rinse your body with warm water from the neck down.  8. DO NOT shower/wash with your normal soap after using and rinsing off the CHG Soap.  9. Pat yourself dry with a CLEAN  TOWEL.  10. Wear CLEAN PAJAMAS to bed the night before surgery, wear comfortable clothes the morning of surgery  11. Place CLEAN SHEETS on your bed the night of your first shower and DO NOT SLEEP WITH PETS.    Day of Surgery:  Do not apply any deodorants/lotions.  Please wear clean clothes to the hospital/surgery center.   Remember to brush your teeth WITH YOUR REGULAR TOOTHPASTE.    Please read over the following fact sheets that you were given.

## 2020-04-13 ENCOUNTER — Encounter (HOSPITAL_COMMUNITY): Payer: Self-pay

## 2020-04-13 ENCOUNTER — Other Ambulatory Visit: Payer: Self-pay

## 2020-04-13 ENCOUNTER — Other Ambulatory Visit (HOSPITAL_COMMUNITY)
Admission: RE | Admit: 2020-04-13 | Discharge: 2020-04-13 | Disposition: A | Discharge status: Home / Self Care | Payer: Medicare Other | Source: Ambulatory Visit | Attending: Neurosurgery | Admitting: Neurosurgery

## 2020-04-13 ENCOUNTER — Encounter (HOSPITAL_COMMUNITY)
Admission: RE | Admit: 2020-04-13 | Discharge: 2020-04-13 | Disposition: A | Discharge status: Home / Self Care | Payer: Medicare Other | Source: Ambulatory Visit | Attending: Neurosurgery | Admitting: Neurosurgery

## 2020-04-13 DIAGNOSIS — K219 Gastro-esophageal reflux disease without esophagitis: Secondary | ICD-10-CM | POA: Diagnosis not present

## 2020-04-13 DIAGNOSIS — Z79899 Other long term (current) drug therapy: Secondary | ICD-10-CM | POA: Diagnosis not present

## 2020-04-13 DIAGNOSIS — Z01812 Encounter for preprocedural laboratory examination: Secondary | ICD-10-CM | POA: Insufficient documentation

## 2020-04-13 DIAGNOSIS — Z20822 Contact with and (suspected) exposure to covid-19: Secondary | ICD-10-CM | POA: Insufficient documentation

## 2020-04-13 DIAGNOSIS — I10 Essential (primary) hypertension: Secondary | ICD-10-CM | POA: Insufficient documentation

## 2020-04-13 DIAGNOSIS — Z87891 Personal history of nicotine dependence: Secondary | ICD-10-CM | POA: Insufficient documentation

## 2020-04-13 HISTORY — DX: Depression, unspecified: F32.A

## 2020-04-13 LAB — CBC
HCT: 47.6 % (ref 39.0–52.0)
Hemoglobin: 15.5 g/dL (ref 13.0–17.0)
MCH: 31.7 pg (ref 26.0–34.0)
MCHC: 32.6 g/dL (ref 30.0–36.0)
MCV: 97.3 fL (ref 80.0–100.0)
Platelets: 243 10*3/uL (ref 150–400)
RBC: 4.89 MIL/uL (ref 4.22–5.81)
RDW: 13 % (ref 11.5–15.5)
WBC: 4.7 10*3/uL (ref 4.0–10.5)
nRBC: 0 % (ref 0.0–0.2)

## 2020-04-13 LAB — BASIC METABOLIC PANEL
Anion gap: 9 (ref 5–15)
BUN: 19 mg/dL (ref 8–23)
CO2: 27 mmol/L (ref 22–32)
Calcium: 9.1 mg/dL (ref 8.9–10.3)
Chloride: 104 mmol/L (ref 98–111)
Creatinine, Ser: 0.9 mg/dL (ref 0.61–1.24)
GFR, Estimated: >60 mL/min (ref >60)
Glucose, Bld: 107 mg/dL — ABNORMAL HIGH (ref 70–99)
Potassium: 4.7 mmol/L (ref 3.5–5.1)
Sodium: 140 mmol/L (ref 135–145)

## 2020-04-13 LAB — TYPE AND SCREEN
ABO/RH(D): O POS
Antibody Screen: NEGATIVE

## 2020-04-13 LAB — SURGICAL PCR SCREEN
MRSA, PCR: NEGATIVE
Staphylococcus aureus: NEGATIVE

## 2020-04-13 LAB — SARS CORONAVIRUS 2 (TAT 6-24 HRS): SARS Coronavirus 2: NEGATIVE

## 2020-04-13 NOTE — Progress Notes (Signed)
PCP - Shon Baton Cardiologist - na   Chest x-ray -  na EKG - 07/24/19 Stress Test -  Yrs. Ago--normal ECHO - 08/01/16 Cardiac Cath - na  Sleep Study - na   Blood Thinner Instructions: na Aspirin Instructions:  na  ERAS Protcol - na   COVID TEST-  04/13/20   Anesthesia review: ekg/bp  Pt. Reports he has white coat syndrome whenever he goes to doctor's appointments. States he checks blood pressure at home and it's in normal range.  Patient denies shortness of breath, fever, cough and chest pain at PAT appointment   All instructions explained to the patient, with a verbal understanding of the material. Patient agrees to go over the instructions while at home for a better understanding. Patient also instructed to self quarantine after being tested for COVID-19. The opportunity to ask questions was provided.

## 2020-04-14 NOTE — Progress Notes (Signed)
Anesthesia Chart Review:  Case: [id number] Date/Time: 04/17/20 0715   Procedures:      ALIF - L5-S1 (N/A )     ABDOMINAL EXPOSURE (N/A )   Anesthesia type: General   Pre-op diagnosis: Pseudoarthrosis   Location: [facility] OR ROOM 21 / [facility]   Surgeons: Kary Kos, MD; Marty Heck, MD      DISCUSSION: Patient is a 79 year old male scheduled for the above procedure.   History includes former smoker (10 pack years), HTN, GERD, BPH (s/p TURP 08/02/13), back pain/radiculopathy (s/p left L5-S1 laminotomy/foraminotomy/L5-S1 posterior lateral arthrodesis 03/24/13), depression.  BP 171/90 at PAT. He has known HTN, [name] On 04/14/20 AM, PCP records requested but not yet received. I did notify Lorriane Shire at Dr. Windy Carina office of elevated BP readings.  04/13/20 presurgical COVID-19 test negative. [name]   VS: BP (!) 171/90   Pulse 84   Temp 36.7 C (Oral)   Resp 18   Ht 5\' 9"  (1.753 m)   Wt 84.4 kg   SpO2 98%   BMI 27.47 kg/m   BP Readings from Last 3 Encounters:  04/13/20 (!) 171/90  03/28/20 (!) 183/92  02/29/20 (!) 143/71    PROVIDERS: Shon Baton, MD is PCP    LABS: Labs reviewed: Acceptable for surgery. (all labs ordered are listed, but only abnormal results are displayed)  Labs Reviewed  BASIC METABOLIC PANEL - Abnormal; Notable for the following components:      Result Value   Glucose, Bld 107 (*)    All other components within normal limits  SURGICAL PCR SCREEN  CBC  TYPE AND SCREEN     IMAGES: CT L-spine 02/29/20: IMPRESSION: 1. No high-grade stenosis or impingement to explain the patient's left lower leg and foot symptoms. 2. Prior L5-S1 posterior fusion with unchanged loosening of the left L5 and right S1 pedicle screws, without solid arthrodesis. Unchanged moderate to severe right neuroforaminal  stenosis. 3. Unchanged moderate bilateral neuroforaminal stenosis at L3-L4 and L4-L5. 4. Aortic Atherosclerosis (ICD10-I70.0).   EKG: Sinus rhythm Left atrial enlargement Anteroseptal infarct, age indeterminate No acute changes Confirmed by Addison Lank [phone]) on 07/24/2019 6:44:31 AM   CV: Echo 08/01/16: Study Conclusions  - Left ventricle: The cavity size was normal. Wall thickness was  normal. Systolic function was normal. The estimated ejection  fraction was in the range of 60% to 65%. Doppler parameters are  consistent with abnormal left ventricular relaxation (grade 1  diastolic dysfunction).  - Mitral valve: Calcified annulus. Mildly thickened leaflets .  There was mild regurgitation.   Carotid US 07/23/16: IMPRESSION: 1. Carotid atherosclerosis with mild amount of partially calcified plaque at the level of both carotid bulbs and proximal internal carotid arteries. Estimated bilateral ICA stenoses are less than 50%. 2. Incidental heterogeneity of the left lobe of the thyroid gland with potential subtle solid/cystic thyroid nodule measuring approximately 1.5 cm. Correlation with formal thyroid ultrasound may be helpful. (S/p thyroid US on 07/30/16; 10/21/17 with benign FNA 11/07/16; 11/20/18)    Past Medical History:  Diagnosis Date  . Arthritis   . BPH (benign prostatic hyperplasia)   . Depression   . GERD (gastroesophageal reflux disease)    occasionally   . History of kidney stones   . Hypertension   . Mild back pain   . Numbness and tingling   .  Pain    BACK PAIN - HAD BACK SURG OCT 2014 - STILL A LOT OF PAIN  . Pain in left shin    Lateral radiating to the bottom of left foot.  . 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    Past Surgical History:  Procedure Laterality Date  . APPENDECTOMY    . COLONOSCOPY W/ BIOPSIES AND POLYPECTOMY  2013  . EXTRACORPOREAL SHOCK WAVE LITHOTRIPSY Left 09/11/2017    Procedure: LEFT EXTRACORPOREAL SHOCK WAVE LITHOTRIPSY (ESWL);  Surgeon: Ceasar Mons, MD;  Location: [facility];  Service: Urology;  Laterality: Left;  . LAMINECTOMY WITH POSTERIOR LATERAL ARTHRODESIS LEVEL 1 Left 03/24/2013   Procedure: LEFT LUMBAR FIVE-SACRAL ONE LUMBAR LAMINECTOMY,FACETECTOMY,FORAMINOTOMY AND POSSIBLE MICRODISKECTOMY,POSTERIOR LATERAL ARTHRODESIS;  Surgeon: Hosie Spangle, MD;  Location: [facility];  Service: Neurosurgery;  Laterality: Left;  left  . NECK SURGERY Left 1978   piece of metal removed, pt. reports he does have some metal in the neck  . TONSILLECTOMY    . TRANSURETHRAL RESECTION OF PROSTATE N/A 08/02/2013   Procedure: TRANSURETHRAL RESECTION OF THE PROSTATE WITH GYRUS INSTRUMENTS;  Surgeon: Ailene Rud, MD;  Location: [facility];  Service: Urology;  Laterality: N/A;    MEDICATIONS: . ibuprofen (ADVIL) 200 MG tablet  . losartan (COZAAR) 50 MG tablet  . omeprazole (PRILOSEC OTC) 20 MG tablet   No current facility-administered medications for this encounter.    Brent Gianotti, PA-C Surgical Short Stay/Anesthesiology [facility] Phone [phone] [facility] Phone [phone] 04/14/2020 4:40 PM

## 2020-04-14 NOTE — Anesthesia Preprocedure Evaluation (Addendum)
Anesthesia Evaluation  Patient identified by MRN, date of birth, ID band Patient awake    Reviewed: Allergy & Precautions, NPO status , Patient's Chart, lab work & pertinent test results  History of Anesthesia Complications Negative for: history of anesthetic complications  Airway Mallampati: II  TM Distance: >3 FB Neck ROM: Full    Dental no notable dental hx. (+) Dental Advisory Given   Pulmonary former smoker,    Pulmonary exam normal        Cardiovascular hypertension, Pt. on medications Normal cardiovascular exam  Echo 08/01/16: Study Conclusions  - Left ventricle: The cavity size was normal. Wall thickness was  normal. Systolic function was normal. The estimated ejection  fraction was in the range of 60% to 65%. Doppler parameters are  consistent with abnormal left ventricular relaxation (grade 1  diastolic dysfunction).  - Mitral valve: Calcified annulus. Mildly thickened leaflets .  There was mild regurgitation.     Neuro/Psych PSYCHIATRIC DISORDERS Depression    GI/Hepatic Neg liver ROS, GERD  Medicated,  Endo/Other  negative endocrine ROS  Renal/GU negative Renal ROS     Musculoskeletal negative musculoskeletal ROS (+)   Abdominal   Peds  Hematology negative hematology ROS (+)   Anesthesia Other Findings   Reproductive/Obstetrics                           Anesthesia Physical Anesthesia Plan  ASA: III  Anesthesia Plan: General   Post-op Pain Management:    Induction: Intravenous  PONV Risk Score and Plan: 3 and Ondansetron, Dexamethasone and Diphenhydramine  Airway Management Planned: Oral ETT  Additional Equipment:   Intra-op Plan:   Post-operative Plan: Extubation in OR  Informed Consent: I have reviewed the patients History and Physical, chart, labs and discussed the procedure including the risks, benefits and alternatives for the proposed anesthesia  with the patient or authorized representative who has indicated his/her understanding and acceptance.     Dental advisory given  Plan Discussed with: Anesthesiologist and CRNA  Anesthesia Plan Comments: (PAT note written 04/14/2020 by Myra Gianotti, PA-C. )      Anesthesia Quick Evaluation

## 2020-04-17 ENCOUNTER — Inpatient Hospital Stay (HOSPITAL_COMMUNITY): Payer: Medicare Other

## 2020-04-17 ENCOUNTER — Inpatient Hospital Stay (HOSPITAL_COMMUNITY): Payer: Medicare Other | Admitting: Physician Assistant

## 2020-04-17 ENCOUNTER — Encounter (HOSPITAL_COMMUNITY): Payer: Self-pay | Admitting: Neurosurgery

## 2020-04-17 ENCOUNTER — Other Ambulatory Visit: Payer: Self-pay

## 2020-04-17 ENCOUNTER — Inpatient Hospital Stay (HOSPITAL_COMMUNITY): Payer: Medicare Other | Admitting: Certified Registered"

## 2020-04-17 ENCOUNTER — Encounter (HOSPITAL_COMMUNITY)
Admission: RE | Disposition: A | Discharge status: Home / Self Care | Payer: Self-pay | Source: Home / Self Care | Attending: Neurosurgery

## 2020-04-17 ENCOUNTER — Observation Stay (HOSPITAL_COMMUNITY)
Admission: RE | Admit: 2020-04-17 | Discharge: 2020-04-18 | Disposition: A | Discharge status: Home / Self Care | Payer: Medicare Other | Attending: Neurosurgery | Admitting: Neurosurgery

## 2020-04-17 DIAGNOSIS — K219 Gastro-esophageal reflux disease without esophagitis: Secondary | ICD-10-CM | POA: Diagnosis not present

## 2020-04-17 DIAGNOSIS — S32009K Unspecified fracture of unspecified lumbar vertebra, subsequent encounter for fracture with nonunion: Secondary | ICD-10-CM | POA: Diagnosis present

## 2020-04-17 DIAGNOSIS — Z981 Arthrodesis status: Secondary | ICD-10-CM | POA: Diagnosis not present

## 2020-04-17 DIAGNOSIS — Z87891 Personal history of nicotine dependence: Secondary | ICD-10-CM | POA: Insufficient documentation

## 2020-04-17 DIAGNOSIS — M4326 Fusion of spine, lumbar region: Secondary | ICD-10-CM | POA: Diagnosis not present

## 2020-04-17 DIAGNOSIS — F32A Depression, unspecified: Secondary | ICD-10-CM | POA: Diagnosis not present

## 2020-04-17 DIAGNOSIS — M960 Pseudarthrosis after fusion or arthrodesis: Secondary | ICD-10-CM | POA: Diagnosis not present

## 2020-04-17 DIAGNOSIS — Z419 Encounter for procedure for purposes other than remedying health state, unspecified: Secondary | ICD-10-CM

## 2020-04-17 DIAGNOSIS — I10 Essential (primary) hypertension: Secondary | ICD-10-CM | POA: Diagnosis not present

## 2020-04-17 HISTORY — PX: ANTERIOR LUMBAR FUSION: SHX1170

## 2020-04-17 HISTORY — PX: ABDOMINAL EXPOSURE: SHX5708

## 2020-04-17 SURGERY — ANTERIOR LUMBAR FUSION 1 LEVEL
Anesthesia: General

## 2020-04-17 MED ORDER — ORAL CARE MOUTH RINSE: 15.0000 mL | Freq: Once | OROMUCOSAL | Status: AC

## 2020-04-17 MED ORDER — SODIUM CHLORIDE 0.9% FLUSH: 3.0000 mL | INTRAVENOUS | Status: DC | PRN

## 2020-04-17 MED ORDER — DEXAMETHASONE SODIUM PHOSPHATE 10 MG/ML IJ SOLN
INTRAMUSCULAR | Status: AC
  Filled 2020-04-17: qty 1

## 2020-04-17 MED ORDER — CHLORHEXIDINE GLUCONATE CLOTH 2 % EX PADS: 6.0000 | MEDICATED_PAD | Freq: Once | CUTANEOUS | Status: DC

## 2020-04-17 MED ORDER — CHLORHEXIDINE GLUCONATE 0.12 % MT SOLN
15.0000 mL | Freq: Once | OROMUCOSAL | Status: AC
  Administered 2020-04-17: 15 mL via OROMUCOSAL
  Filled 2020-04-17: qty 15

## 2020-04-17 MED ORDER — ONDANSETRON HCL 4 MG PO TABS: 4.0000 mg | ORAL_TABLET | Freq: Four times a day (QID) | ORAL | Status: DC | PRN

## 2020-04-17 MED ORDER — MIDAZOLAM HCL 2 MG/2ML IJ SOLN
INTRAMUSCULAR | Status: AC
  Filled 2020-04-17: qty 2

## 2020-04-17 MED ORDER — THROMBIN 20000 UNITS EX SOLR
CUTANEOUS | Status: AC
  Filled 2020-04-17: qty 20000

## 2020-04-17 MED ORDER — FENTANYL CITRATE (PF) 100 MCG/2ML IJ SOLN
INTRAMUSCULAR | Status: DC | PRN
  Administered 2020-04-17: 50 ug via INTRAVENOUS
  Administered 2020-04-17: 100 ug via INTRAVENOUS
  Administered 2020-04-17: 50 ug via INTRAVENOUS

## 2020-04-17 MED ORDER — PHENYLEPHRINE 40 MCG/ML (10ML) SYRINGE FOR IV PUSH (FOR BLOOD PRESSURE SUPPORT)
PREFILLED_SYRINGE | INTRAVENOUS | Status: DC | PRN
  Administered 2020-04-17 (×3): 40 ug via INTRAVENOUS

## 2020-04-17 MED ORDER — MENTHOL 3 MG MT LOZG: 1.0000 | LOZENGE | OROMUCOSAL | Status: DC | PRN

## 2020-04-17 MED ORDER — CYCLOBENZAPRINE HCL 10 MG PO TABS
10.0000 mg | ORAL_TABLET | Freq: Three times a day (TID) | ORAL | Status: DC | PRN
  Administered 2020-04-17 (×2): 10 mg via ORAL
  Filled 2020-04-17: qty 1

## 2020-04-17 MED ORDER — FENTANYL CITRATE (PF) 100 MCG/2ML IJ SOLN
INTRAMUSCULAR | Status: AC
  Filled 2020-04-17: qty 2

## 2020-04-17 MED ORDER — PROPOFOL 10 MG/ML IV BOLUS
INTRAVENOUS | Status: DC | PRN
  Administered 2020-04-17: 200 mg via INTRAVENOUS
  Administered 2020-04-17: 50 mg via INTRAVENOUS

## 2020-04-17 MED ORDER — ROCURONIUM BROMIDE 10 MG/ML (PF) SYRINGE
PREFILLED_SYRINGE | INTRAVENOUS | Status: AC
  Filled 2020-04-17: qty 10

## 2020-04-17 MED ORDER — FENTANYL CITRATE (PF) 100 MCG/2ML IJ SOLN
25.0000 ug | INTRAMUSCULAR | Status: DC | PRN
  Administered 2020-04-17: 50 ug via INTRAVENOUS

## 2020-04-17 MED ORDER — ONDANSETRON HCL 4 MG/2ML IJ SOLN
INTRAMUSCULAR | Status: DC | PRN
  Administered 2020-04-17: 4 mg via INTRAVENOUS

## 2020-04-17 MED ORDER — ALUM & MAG HYDROXIDE-SIMETH 200-200-20 MG/5ML PO SUSP
30.0000 mL | Freq: Four times a day (QID) | ORAL | Status: DC | PRN
  Administered 2020-04-18: 30 mL via ORAL
  Filled 2020-04-17: qty 30

## 2020-04-17 MED ORDER — CYCLOBENZAPRINE HCL 10 MG PO TABS
ORAL_TABLET | ORAL | Status: AC
  Filled 2020-04-17: qty 1

## 2020-04-17 MED ORDER — ACETAMINOPHEN 650 MG RE SUPP: 650.0000 mg | RECTAL | Status: DC | PRN

## 2020-04-17 MED ORDER — LIDOCAINE HCL (PF) 2 % IJ SOLN
INTRAMUSCULAR | Status: AC
  Filled 2020-04-17: qty 5

## 2020-04-17 MED ORDER — ONDANSETRON HCL 4 MG/2ML IJ SOLN
INTRAMUSCULAR | Status: AC
  Filled 2020-04-17: qty 2

## 2020-04-17 MED ORDER — LIDOCAINE HCL (CARDIAC) PF 100 MG/5ML IV SOSY
PREFILLED_SYRINGE | INTRAVENOUS | Status: DC | PRN
  Administered 2020-04-17: 100 mg via INTRAVENOUS

## 2020-04-17 MED ORDER — LOSARTAN POTASSIUM 50 MG PO TABS
50.0000 mg | ORAL_TABLET | Freq: Every day | ORAL | Status: DC
  Administered 2020-04-17: 50 mg via ORAL
  Filled 2020-04-17: qty 1

## 2020-04-17 MED ORDER — PROPOFOL 10 MG/ML IV BOLUS
INTRAVENOUS | Status: AC
  Filled 2020-04-17: qty 20

## 2020-04-17 MED ORDER — FENTANYL CITRATE (PF) 250 MCG/5ML IJ SOLN
INTRAMUSCULAR | Status: AC
  Filled 2020-04-17: qty 5

## 2020-04-17 MED ORDER — HYDROMORPHONE HCL 1 MG/ML IJ SOLN: 0.5000 mg | INTRAMUSCULAR | Status: DC | PRN

## 2020-04-17 MED ORDER — SODIUM CHLORIDE 0.9 % IV SOLN: 250.0000 mL | INTRAVENOUS | Status: DC

## 2020-04-17 MED ORDER — LACTATED RINGERS IV SOLN: INTRAVENOUS | Status: DC

## 2020-04-17 MED ORDER — DIPHENHYDRAMINE HCL 50 MG/ML IJ SOLN
INTRAMUSCULAR | Status: DC | PRN
  Administered 2020-04-17: 12.5 mg via INTRAVENOUS

## 2020-04-17 MED ORDER — PHENOL 1.4 % MT LIQD: 1.0000 | OROMUCOSAL | Status: DC | PRN

## 2020-04-17 MED ORDER — DEXAMETHASONE SODIUM PHOSPHATE 10 MG/ML IJ SOLN: 10.0000 mg | Freq: Once | INTRAMUSCULAR | Status: DC

## 2020-04-17 MED ORDER — PANTOPRAZOLE SODIUM 20 MG PO TBEC: 20.0000 mg | DELAYED_RELEASE_TABLET | ORAL | Status: DC

## 2020-04-17 MED ORDER — PROMETHAZINE HCL 25 MG/ML IJ SOLN: 6.2500 mg | INTRAMUSCULAR | Status: DC | PRN

## 2020-04-17 MED ORDER — CEFAZOLIN SODIUM-DEXTROSE 2-4 GM/100ML-% IV SOLN
2.0000 g | INTRAVENOUS | Status: AC
  Administered 2020-04-17: 2 g via INTRAVENOUS
  Filled 2020-04-17: qty 100

## 2020-04-17 MED ORDER — THROMBIN 20000 UNITS EX SOLR
CUTANEOUS | Status: DC | PRN
  Administered 2020-04-17: 20 mL via TOPICAL

## 2020-04-17 MED ORDER — CHLORHEXIDINE GLUCONATE 4 % EX LIQD: 60.0000 mL | Freq: Once | CUTANEOUS | Status: DC

## 2020-04-17 MED ORDER — ACETAMINOPHEN 500 MG PO TABS
1000.0000 mg | ORAL_TABLET | Freq: Once | ORAL | Status: AC
  Administered 2020-04-17: 1000 mg via ORAL
  Filled 2020-04-17: qty 2

## 2020-04-17 MED ORDER — PHENYLEPHRINE HCL-NACL 10-0.9 MG/250ML-% IV SOLN
INTRAVENOUS | Status: DC | PRN
  Administered 2020-04-17: 20 ug/min via INTRAVENOUS

## 2020-04-17 MED ORDER — ROCURONIUM BROMIDE 10 MG/ML (PF) SYRINGE
PREFILLED_SYRINGE | INTRAVENOUS | Status: DC | PRN
  Administered 2020-04-17: 50 mg via INTRAVENOUS
  Administered 2020-04-17: 100 mg via INTRAVENOUS

## 2020-04-17 MED ORDER — SODIUM CHLORIDE 0.9% FLUSH
3.0000 mL | Freq: Two times a day (BID) | INTRAVENOUS | Status: DC
  Administered 2020-04-17: 3 mL via INTRAVENOUS

## 2020-04-17 MED ORDER — DIPHENHYDRAMINE HCL 50 MG/ML IJ SOLN
INTRAMUSCULAR | Status: AC
  Filled 2020-04-17: qty 1

## 2020-04-17 MED ORDER — MIDAZOLAM HCL 5 MG/5ML IJ SOLN
INTRAMUSCULAR | Status: DC | PRN
  Administered 2020-04-17 (×2): 1 mg via INTRAVENOUS

## 2020-04-17 MED ORDER — DEXAMETHASONE SODIUM PHOSPHATE 10 MG/ML IJ SOLN
INTRAMUSCULAR | Status: DC | PRN
  Administered 2020-04-17: 10 mg via INTRAVENOUS

## 2020-04-17 MED ORDER — PANTOPRAZOLE SODIUM 40 MG IV SOLR: 40.0000 mg | Freq: Every day | INTRAVENOUS | Status: DC

## 2020-04-17 MED ORDER — CELECOXIB 200 MG PO CAPS
200.0000 mg | ORAL_CAPSULE | Freq: Once | ORAL | Status: AC
  Administered 2020-04-17: 200 mg via ORAL
  Filled 2020-04-17: qty 1

## 2020-04-17 MED ORDER — ACETAMINOPHEN 325 MG PO TABS: 650.0000 mg | ORAL_TABLET | ORAL | Status: DC | PRN

## 2020-04-17 MED ORDER — 0.9 % SODIUM CHLORIDE (POUR BTL) OPTIME
TOPICAL | Status: DC | PRN
  Administered 2020-04-17: 1000 mL

## 2020-04-17 MED ORDER — CEFAZOLIN SODIUM-DEXTROSE 2-4 GM/100ML-% IV SOLN
2.0000 g | Freq: Three times a day (TID) | INTRAVENOUS | Status: AC
  Administered 2020-04-17 (×2): 2 g via INTRAVENOUS
  Filled 2020-04-17 (×2): qty 100

## 2020-04-17 MED ORDER — SUGAMMADEX SODIUM 200 MG/2ML IV SOLN
INTRAVENOUS | Status: DC | PRN
  Administered 2020-04-17: 200 mg via INTRAVENOUS

## 2020-04-17 MED ORDER — ONDANSETRON HCL 4 MG/2ML IJ SOLN: 4.0000 mg | Freq: Four times a day (QID) | INTRAMUSCULAR | Status: DC | PRN

## 2020-04-17 MED ORDER — PHENYLEPHRINE 40 MCG/ML (10ML) SYRINGE FOR IV PUSH (FOR BLOOD PRESSURE SUPPORT)
PREFILLED_SYRINGE | INTRAVENOUS | Status: AC
  Filled 2020-04-17: qty 10

## 2020-04-17 MED ORDER — OXYCODONE HCL 5 MG PO TABS
10.0000 mg | ORAL_TABLET | ORAL | Status: DC | PRN
  Administered 2020-04-17 (×2): 10 mg via ORAL
  Filled 2020-04-17 (×3): qty 2

## 2020-04-17 MED ORDER — LACTATED RINGERS IV SOLN: INTRAVENOUS | Status: DC | PRN

## 2020-04-17 SURGICAL SUPPLY — 90 items
ADH SKN CLS APL DERMABOND .7 (GAUZE/BANDAGES/DRESSINGS) ×1
ANCH SPNL 27 LMBR MIS (Anchor) ×3 IMPLANT
ANCHOR LUMBAR 27 (Anchor) ×6 IMPLANT
ANCHOR LUMBAR 27MM (Anchor) ×3 IMPLANT
APL SKNCLS STERI-STRIP NONHPOA (GAUZE/BANDAGES/DRESSINGS)
APPLIER CLIP 11 MED OPEN (CLIP) ×3
APR CLP MED 11 20 MLT OPN (CLIP) ×1
BENZOIN TINCTURE PRP APPL 2/3 (GAUZE/BANDAGES/DRESSINGS) IMPLANT
BONE MATRIX OSTEOCEL PRO MED (Bone Implant) ×3 IMPLANT
BUR MATCHSTICK NEURO 3.0 LAGG (BURR) ×3 IMPLANT
CANISTER SUCT 3000ML PPV (MISCELLANEOUS) ×3 IMPLANT
CARTRIDGE OIL MAESTRO DRILL (MISCELLANEOUS) ×1 IMPLANT
CLIP APPLIE 11 MED OPEN (CLIP) ×1 IMPLANT
CLIP LIGATING EXTRA MED SLVR (CLIP) IMPLANT
CLIP LIGATING EXTRA SM BLUE (MISCELLANEOUS) IMPLANT
CLOSURE WOUND 1/2 X4 (GAUZE/BANDAGES/DRESSINGS) ×1
COVER WAND RF STERILE (DRAPES) ×3 IMPLANT
DERMABOND ADVANCED (GAUZE/BANDAGES/DRESSINGS) ×2
DERMABOND ADVANCED .7 DNX12 (GAUZE/BANDAGES/DRESSINGS) ×1 IMPLANT
DIFFUSER DRILL AIR PNEUMATIC (MISCELLANEOUS) ×3 IMPLANT
DRAPE C-ARM 42X72 X-RAY (DRAPES) ×3 IMPLANT
DRAPE C-ARMOR (DRAPES) ×3 IMPLANT
DRAPE LAPAROTOMY 100X72X124 (DRAPES) ×3 IMPLANT
DRSG OPSITE POSTOP 4X6 (GAUZE/BANDAGES/DRESSINGS) ×3 IMPLANT
ELECT BLADE 4.0 EZ CLEAN MEGAD (MISCELLANEOUS) ×3
ELECT BLADE 6.5 EXT (BLADE) ×3 IMPLANT
ELECT REM PT RETURN 9FT ADLT (ELECTROSURGICAL) ×3
ELECTRODE BLDE 4.0 EZ CLN MEGD (MISCELLANEOUS) ×1 IMPLANT
ELECTRODE REM PT RTRN 9FT ADLT (ELECTROSURGICAL) ×1 IMPLANT
GAUZE 4X4 16PLY RFD (DISPOSABLE) IMPLANT
GAUZE SPONGE 4X4 12PLY STRL (GAUZE/BANDAGES/DRESSINGS) ×3 IMPLANT
GLOVE BIO SURGEON STRL SZ7 (GLOVE) ×3 IMPLANT
GLOVE BIO SURGEON STRL SZ7.5 (GLOVE) ×3 IMPLANT
GLOVE BIO SURGEON STRL SZ8 (GLOVE) ×6 IMPLANT
GLOVE BIOGEL PI IND STRL 7.0 (GLOVE) ×1 IMPLANT
GLOVE BIOGEL PI IND STRL 8 (GLOVE) ×1 IMPLANT
GLOVE BIOGEL PI INDICATOR 7.0 (GLOVE) ×2
GLOVE BIOGEL PI INDICATOR 8 (GLOVE) ×2
GLOVE EXAM NITRILE XL STR (GLOVE) IMPLANT
GLOVE INDICATOR 8.5 STRL (GLOVE) ×3 IMPLANT
GLOVE SS BIOGEL STRL SZ 7.5 (GLOVE) ×1 IMPLANT
GLOVE SUPERSENSE BIOGEL SZ 7.5 (GLOVE) ×2
GOWN STRL REUS W/ TWL LRG LVL3 (GOWN DISPOSABLE) ×3 IMPLANT
GOWN STRL REUS W/ TWL XL LVL3 (GOWN DISPOSABLE) ×3 IMPLANT
GOWN STRL REUS W/TWL 2XL LVL3 (GOWN DISPOSABLE) IMPLANT
GOWN STRL REUS W/TWL LRG LVL3 (GOWN DISPOSABLE) ×9
GOWN STRL REUS W/TWL XL LVL3 (GOWN DISPOSABLE) ×9
HEMOSTAT SNOW SURGICEL 2X4 (HEMOSTASIS) IMPLANT
INSERT FOGARTY 61MM (MISCELLANEOUS) IMPLANT
INSERT FOGARTY SM (MISCELLANEOUS) IMPLANT
KIT BASIN OR (CUSTOM PROCEDURE TRAY) ×3 IMPLANT
KIT INFUSE X SMALL 1.4CC (Orthopedic Implant) ×3 IMPLANT
LOOP VESSEL MAXI BLUE (MISCELLANEOUS) IMPLANT
LOOP VESSEL MINI RED (MISCELLANEOUS) IMPLANT
NEEDLE SPNL 18GX3.5 QUINCKE PK (NEEDLE) ×3 IMPLANT
NS IRRIG 1000ML POUR BTL (IV SOLUTION) ×3 IMPLANT
OIL CARTRIDGE MAESTRO DRILL (MISCELLANEOUS) ×3
PACK LAMINECTOMY NEURO (CUSTOM PROCEDURE TRAY) ×3 IMPLANT
PAD ARMBOARD 7.5X6 YLW CONV (MISCELLANEOUS) IMPLANT
SPACER HEDRON IA 29X39 13 15D (Spacer) ×3 IMPLANT
SPONGE INTESTINAL PEANUT (DISPOSABLE) ×9 IMPLANT
SPONGE LAP 18X18 RF (DISPOSABLE) ×3 IMPLANT
SPONGE LAP 4X18 RFD (DISPOSABLE) IMPLANT
SPONGE SURGIFOAM ABS GEL 100 (HEMOSTASIS) ×3 IMPLANT
STAPLER VISISTAT 35W (STAPLE) ×3 IMPLANT
STRIP CLOSURE SKIN 1/2X4 (GAUZE/BANDAGES/DRESSINGS) ×2 IMPLANT
SUT PDS AB 1 CTX 36 (SUTURE) ×3 IMPLANT
SUT PROLENE 4 0 RB 1 (SUTURE)
SUT PROLENE 4-0 RB1 .5 CRCL 36 (SUTURE) IMPLANT
SUT PROLENE 5 0 CC1 (SUTURE) IMPLANT
SUT PROLENE 6 0 C 1 30 (SUTURE) IMPLANT
SUT PROLENE 6 0 CC (SUTURE) IMPLANT
SUT SILK 0 TIES 10X30 (SUTURE) ×3 IMPLANT
SUT SILK 2 0 TIES 10X30 (SUTURE) ×3 IMPLANT
SUT SILK 2 0SH CR/8 30 (SUTURE) IMPLANT
SUT SILK 3 0 SH CR/8 (SUTURE) IMPLANT
SUT SILK 3 0 TIES 17X18 (SUTURE)
SUT SILK 3 0SH CR/8 30 (SUTURE) IMPLANT
SUT SILK 3-0 18XBRD TIE BLK (SUTURE) IMPLANT
SUT VIC AB 0 CT1 18XCR BRD8 (SUTURE) ×1 IMPLANT
SUT VIC AB 0 CT1 27 (SUTURE) ×3
SUT VIC AB 0 CT1 27XBRD ANBCTR (SUTURE) ×1 IMPLANT
SUT VIC AB 0 CT1 27XBRD ANTBC (SUTURE) IMPLANT
SUT VIC AB 0 CT1 8-18 (SUTURE) ×3
SUT VIC AB 2-0 CT1 18 (SUTURE) ×3 IMPLANT
SUT VIC AB 4-0 PS2 27 (SUTURE) ×3 IMPLANT
TOWEL GREEN STERILE (TOWEL DISPOSABLE) ×6 IMPLANT
TOWEL GREEN STERILE FF (TOWEL DISPOSABLE) ×3 IMPLANT
TRAY FOLEY MTR SLVR 16FR STAT (SET/KITS/TRAYS/PACK) ×3 IMPLANT
WATER STERILE IRR 1000ML POUR (IV SOLUTION) ×3 IMPLANT

## 2020-04-17 NOTE — H&P (Signed)
History and Physical Interval Note:  04/17/2020 7:21 AM  Brent Hanna  has presented today for surgery, with the diagnosis of Pseudoarthrosis.  The various methods of treatment have been discussed with the patient and family. After consideration of risks, benefits and other options for treatment, the patient has consented to  Procedure(s): ALIF - L5-S1 (N/A) ABDOMINAL EXPOSURE (N/A) as a surgical intervention.  The patient's history has been reviewed, patient examined, no change in status, stable for surgery.  I have reviewed the patient's chart and labs.  Questions were answered to the patient's satisfaction.    L5-S1 ALIF.  Marty Heck  Patient name: Brent Hanna  MRN: [id number]        DOB: 04/07/1941          Sex: male  REASON FOR CONSULT: Evaluate for L5-S1 ALIF  HPI: [name] is a 79 y.o. male, with history of hypertension and chronic back pain who presents for preop evaluation of planned L5-S1 ALIF.  Patient previously had a posterior L5-S1 instrumentation in 2014 by Dr. Sherwood Gambler.  He states in about April of this year he fell and started having back pain with radiculopathy down the left leg.  He has numbness in the left leg particularly into the lateral calf and bottom of the foot.  He has been evaluated by Dr. Saintclair Halsted with neurosurgery who has done extensive work-up and recommended L5-S1 anterior approach with ALIF.  He has failed conservative management with physical therapy and steroid injections etc.  Previous abdominal surgery includes appendectomy when he was a child.  He does not smoke.      Past Medical History:  Diagnosis Date  . Arthritis   . BPH (benign prostatic hyperplasia)   . GERD (gastroesophageal reflux disease)    occasionally   . History of kidney stones   . Hypertension   . Mild back pain   . Numbness and tingling   . Pain    BACK PAIN - HAD BACK SURG OCT 2014 - STILL A LOT OF PAIN  . Pain in left shin    Lateral  radiating to the bottom of left foot.  . 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         Past Surgical History:  Procedure Laterality Date  . APPENDECTOMY    . COLONOSCOPY W/ BIOPSIES AND POLYPECTOMY  2013  . EXTRACORPOREAL SHOCK WAVE LITHOTRIPSY Left 09/11/2017   Procedure: LEFT EXTRACORPOREAL SHOCK WAVE LITHOTRIPSY (ESWL);  Surgeon: Ceasar Mons, MD;  Location: [facility];  Service: Urology;  Laterality: Left;  . LAMINECTOMY WITH POSTERIOR LATERAL ARTHRODESIS LEVEL 1 Left 03/24/2013   Procedure: LEFT LUMBAR FIVE-SACRAL ONE LUMBAR LAMINECTOMY,FACETECTOMY,FORAMINOTOMY AND POSSIBLE MICRODISKECTOMY,POSTERIOR LATERAL ARTHRODESIS;  Surgeon: Hosie Spangle, MD;  Location: [facility];  Service: Neurosurgery;  Laterality: Left;  left  . NECK SURGERY Left 1978   piece of metal removed  . TONSILLECTOMY    . TRANSURETHRAL RESECTION OF PROSTATE N/A 08/02/2013   Procedure: TRANSURETHRAL RESECTION OF THE PROSTATE WITH GYRUS INSTRUMENTS;  Surgeon: Ailene Rud, MD;  Location: [facility];  Service: Urology;  Laterality: N/A;    History reviewed. No pertinent family history.  SOCIAL HISTORY: Social History        Socioeconomic History  . Marital status: Widowed    Spouse name: Not on file  . Number of children: Not on file  . Years of education: Not on file  . Highest education  level: Not on file  Occupational History  . Not on file  Tobacco Use  . Smoking status: Former Smoker    Packs/day: 1.00    Years: 10.00    Pack years: 10.00    Types: Cigarettes  . Smokeless tobacco: Never Used  Vaping Use  . Vaping Use: Never used  Substance and Sexual Activity  . Alcohol use: Yes    Comment: occasionally ALCOHOL  -  QUIT SMOKING WHEN 4O SOMETHING YRS OLD  . Drug use: No  . Sexual activity: Not on file  Other Topics Concern  . Not on file  Social History Narrative  . Not on file   Social  Determinants of Health      Financial Resource Strain:   . Difficulty of Paying Living Expenses: Not on file  Food Insecurity:   . Worried About Charity fundraiser in the Last Year: Not on file  . Ran Out of Food in the Last Year: Not on file  Transportation Needs:   . Lack of Transportation (Medical): Not on file  . Lack of Transportation (Non-Medical): Not on file  Physical Activity:   . Days of Exercise per Week: Not on file  . Minutes of Exercise per Session: Not on file  Stress:   . Feeling of Stress : Not on file  Social Connections:   . Frequency of Communication with Friends and Family: Not on file  . Frequency of Social Gatherings with Friends and Family: Not on file  . Attends Religious Services: Not on file  . Active Member of Clubs or Organizations: Not on file  . Attends Archivist Meetings: Not on file  . Marital Status: Not on file  Intimate Partner Violence:   . Fear of Current or Ex-Partner: Not on file  . Emotionally Abused: Not on file  . Physically Abused: Not on file  . Sexually Abused: Not on file    No Known Allergies        Current Outpatient Medications  Medication Sig Dispense Refill  . losartan (COZAAR) 50 MG tablet Take 25 mg by mouth daily.      No current facility-administered medications for this visit.    REVIEW OF SYSTEMS:  [X]  denotes positive finding, [ ]  denotes negative finding Cardiac  Comments:  Chest pain or chest pressure:    Shortness of breath upon exertion:    Short of breath when lying flat:    Irregular heart rhythm:        Vascular    Pain in calf, thigh, or hip brought on by ambulation:    Pain in feet at night that wakes you up from your sleep:     Blood clot in your veins:    Leg swelling:         Pulmonary    Oxygen at home:    Productive cough:     Wheezing:         Neurologic    Sudden weakness in arms or legs:     Sudden numbness in arms or  legs:  x Left foot  Sudden onset of difficulty speaking or slurred speech:    Temporary loss of vision in one eye:     Problems with dizziness:         Gastrointestinal    Blood in stool:     Vomited blood:         Genitourinary    Burning when urinating:     Blood in  urine:        Psychiatric    Major depression:         Hematologic    Bleeding problems:    Problems with blood clotting too easily:        Skin    Rashes or ulcers:        Constitutional    Fever or chills:      PHYSICAL EXAM:    Vitals:   03/28/20 0833  BP: (!) 183/92  Pulse: 75  Resp: 20  Temp: 98.4 F (36.9 C)  SpO2: 98%  Weight: 186 lb (84.4 kg)  Height: 5\' 9"  (1.753 m)    GENERAL: The patient is a well-nourished male, in no acute distress. The vital signs are documented above. CARDIAC: There is a regular rate and rhythm.  VASCULAR:  Palpable femoral pulses bilaterally Palpable dorsalis pedis pulses bilaterally No lower extremity tissue loss PULMONARY: No respiratory distress. ABDOMEN: Soft and non-tender.  Unable to visualize prior scar from appendectomy.   MUSCULOSKELETAL: There are no major deformities or cyanosis. NEUROLOGIC: No focal weakness or paresthesias are detected. SKIN: There are no ulcers or rashes noted. PSYCHIATRIC: The patient has a normal affect.  DATA:   I independently reviewed his CT lumbar spine from 02/29/2020 and he has a moderately calcified aortoiliac segment.  Prior posterior instrumentation at L5-S1.  Assessment/Plan:  79 year old male with chronic back pain and radiculopathy that presents for preop evaluation of planned L5-S1 ALIF.  After review of patient's recent CT lumbar spine from 02/29/2020 and examining his abdomen I think he would be an excellent candidate for anterior approach.  We talked about transverse incision over the left rectus muscle with entering the retroperitoneal space lateral  and mobilizing the peritoneum and intestines as well as the left ureter across midline as well as left mobilizing the left iliac artery and vein.  We talked about risk of injury to the above structures.  Look forward to assisting Dr. Saintclair Halsted and patient is scheduled for 11/22.   Marty Heck, MD [facility] Office: [phone]

## 2020-04-17 NOTE — Anesthesia Procedure Notes (Signed)
Procedure Name: Intubation Date/Time: 04/17/2020 7:45 AM Performed by: Imagene Riches, CRNA Pre-anesthesia Checklist: Patient identified, Emergency Drugs available, Suction available and Patient being monitored Patient Re-evaluated:Patient Re-evaluated prior to induction Oxygen Delivery Method: Circle System Utilized Preoxygenation: Pre-oxygenation with 100% oxygen Induction Type: IV induction Ventilation: Mask ventilation without difficulty and Oral airway inserted - appropriate to patient size Laryngoscope Size: Sabra Heck and 2 Grade View: Grade II Tube type: Oral Tube size: 7.5 mm Number of attempts: 1 Airway Equipment and Method: Stylet and Oral airway Placement Confirmation: ETT inserted through vocal cords under direct vision,  positive ETCO2 and breath sounds checked- equal and bilateral Secured at: 23 cm Tube secured with: Tape Dental Injury: Teeth and Oropharynx as per pre-operative assessment

## 2020-04-17 NOTE — Op Note (Signed)
Preoperative diagnosis: Pseudoarthrosis L5-S1  Postoperative diagnosis: Same  Procedure: Anterior lumbar interbody fusion L5-S1 utilizing the globus integrated titanium cage with 27 mm Shanks packed with osteocell pro and BMP  Surgeon: Dominica Severin Shaneca Orne  Co-surgeon Dr. Monica Martinez  Anesthesia: General  EBL: Minimal  HPI: 79 year old gentleman previously undergone a posterior lateral fusion at L5-S1 many years ago said persistent back pain left leg numbness and tingling radiating L5-S1 nerve root pattern work-up revealed pseudoarthrosis L5S1 as well as EMG picked up by chronic L5 radiculitis patient failed all forms conservative with anti-inflammatories epidural steroid injections time and therapy.  And due to patient's progression of clinical syndrome imaging findings and failed conservative treatment I recommended anterior lumbar interbody fusion to treat a pseudoarthrosis at L5-S1.  I extensively reviewed the risks and benefits of that procedure with him as well as perioperative course expectations of outcome and alternatives of surgery and he understood and agreed to proceed forward.  Operative procedure: Exposure was performed by Dr. Carlis Abbott will be dictated in a separate operative note and after Dr. Carlis Abbott had achieved adequate exposure at L5-S1 disc base was incised and cleaned out radically with upgoing curettes pituitary rongeurs and Kerrisons.  Utilizing AP and lateral fluoroscopy the disc base was confirmed size and depth and location and once a completed the discectomy and prepared the endplates down the posterior longitudinal ligament I then sized up a 13 mm tall 15 degree lordosis titanium cage packed with the osteocell pro with a piece of BMP sponge sandwiched between it.  Inserted this approximately 1 mm deep to the anterior to byline selected 27 mm Shanks with one shank superiorly to [place] inferiorly and and deployed these under fluoroscopy.  Fluoroscopy confirmed the implant to be in  good position.  Then the retractor was removed no bleeding was identified wound was copiously irrigated meticulous hemostasis was made identified and maintained I then closed the fascia with a running Prolene the subcutaneous tissue with interrupted Vicryls and a running 4 subcuticular in the skin Dermabond a sterile dressing was applied patient recovery in stable condition.  At the end the case all needle counts and sponge counts were correct.

## 2020-04-17 NOTE — Anesthesia Postprocedure Evaluation (Signed)
Anesthesia Post Note  Patient: Brent Hanna  Procedure(s) Performed: Anterior Lumbar Interbody Fusion  - Lumbar five-Sacral one (N/A ) ABDOMINAL EXPOSURE (N/A )     Patient location during evaluation: PACU Anesthesia Type: General Level of consciousness: sedated Pain management: pain level controlled Vital Signs Assessment: post-procedure vital signs reviewed and stable Respiratory status: spontaneous breathing and respiratory function stable Cardiovascular status: stable Postop Assessment: no apparent nausea or vomiting Anesthetic complications: no   No complications documented.  Last Vitals:  Vitals:   04/17/20 1155 04/17/20 1230  BP: 135/75 (!) 141/74  Pulse: 99 100  Resp: 16 18  Temp: (!) 36.4 C 36.9 C  SpO2: 96% 98%    Last Pain:  Vitals:   04/17/20 1230  TempSrc: Oral  PainSc:     LLE Motor Response: (P) Purposeful movement (04/17/20 1230) LLE Sensation: (P) Numbness (Baseline) (04/17/20 1230) RLE Motor Response: (P) Purposeful movement (04/17/20 1230) RLE Sensation: (P) Full sensation (04/17/20 1230)      [place]

## 2020-04-17 NOTE — Progress Notes (Signed)
Orthopedic Tech Progress Note Patient Details:  Brent Hanna 02-04-1941 [id number] Patient has brace. Met wife in [place] with [place] Patient ID: Brent Hanna, male   DOB: [birth year], 79 y.o.   MRN: [id number]   [name] 04/17/2020, 12:42 PM

## 2020-04-17 NOTE — Evaluation (Signed)
Physical Therapy Evaluation Patient Details Name: Brent Hanna MRN: [id number] DOB: 06-10-1940 Today's Date: 04/17/2020   History of Present Illness  79 yo male s/p L5-S1 ALIF on 04/17/20, after fall in 08/2019 resulting in increased back pain and LLE radiculopathy. PMH Includes L lumbar laminectomy L5-S1, HTN.  Clinical Impression   Pt presents with abdominal and back post-operative pain, increased time and effort to mobilize, decreased knowledge and application of spinal precautions, and decreased activity tolerance vs baseline. Pt to benefit from acute PT to address deficits. Pt ambulated hallway distance with no AD and increased time today, reports his gait feels close to baseline. Pt educated on ankle pumps to perform this afternoon/evening to increase circulation, to pt's tolerance and limited by pain. PT to progress mobility as tolerated, and will continue to follow acutely.        Follow Up Recommendations Supervision for mobility/OOB    Equipment Recommendations  None recommended by PT    Recommendations for Other Services       Precautions / Restrictions Precautions Precautions: Fall;Back Precaution Booklet Issued: Yes (comment) Precaution Comments: no bending, lifting, twisting, arching Required Braces or Orthoses: Spinal Brace Spinal Brace: Lumbar corset;Applied in sitting position Restrictions Weight Bearing Restrictions: No      Mobility  Bed Mobility Overal bed mobility: Needs Assistance Bed Mobility: Rolling;Sidelying to Sit;Sit to Sidelying Rolling: Supervision Sidelying to sit: Supervision     Sit to sidelying: Supervision General bed mobility comments: for safety, increased time and effort.    Transfers Overall transfer level: Needs assistance Equipment used: None Transfers: Sit to/from Stand Sit to Stand: Supervision         General transfer comment: for safety, verbal cuing for maintaining spinal precautions  sit>stand.  Ambulation/Gait Ambulation/Gait assistance: Supervision Gait Distance (Feet): 250 Feet Assistive device: None Gait Pattern/deviations: Step-through pattern;Decreased stride length;Trunk flexed Gait velocity: decr   General Gait Details: supervision for safety, verbal cuing for upright posture, avoiding twisting motion during directional changes  Stairs            Wheelchair Mobility    Modified Rankin (Stroke Patients Only)       Balance Overall balance assessment: Mild deficits observed, not formally tested;History of Falls                                           Pertinent Vitals/Pain Pain Assessment: Faces Faces Pain Scale: Hurts little more Pain Location: abdomen from surgical incision and gas Pain Descriptors / Indicators: Discomfort;Grimacing Pain Intervention(s): Limited activity within patient's tolerance;Monitored during session;Repositioned    Home Living Family/patient expects to be discharged to:: Private residence Living Arrangements: Spouse/significant other (girlfriend) Available Help at Discharge: Family;Available 24 hours/day Type of Home: Independent living facility Home Access: [place]: One level Home Equipment: Grab bars - tub/shower;Grab bars - toilet      Prior Function Level of Independence: Independent               Hand Dominance   Dominant Hand: Right    Extremity/Trunk Assessment   Upper Extremity Assessment Upper Extremity Assessment: Defer to OT evaluation    Lower Extremity Assessment Lower Extremity Assessment: Overall WFL for tasks assessed;LLE deficits/detail LLE Sensation: decreased light touch (chronic)    Cervical / Trunk Assessment Cervical / Trunk Assessment: Normal  Communication   Communication: No difficulties  Cognition Arousal/Alertness: Awake/alert  Behavior During Therapy: WFL for tasks assessed/performed Overall Cognitive Status: Within Functional  Limits for tasks assessed                                        General Comments      Exercises     Assessment/Plan    PT Assessment Patient needs continued PT services  PT Problem List Decreased mobility;Decreased safety awareness;Decreased knowledge of precautions;Decreased activity tolerance;Decreased balance;Decreased knowledge of use of DME;Pain;Impaired sensation       PT Treatment Interventions Therapeutic activities;Gait training;Therapeutic exercise;Patient/family education;Balance training;Functional mobility training;Neuromuscular re-education    PT Goals (Current goals can be found in the Care Plan section)  Acute Rehab PT Goals PT Goal Formulation: With patient Time For Goal Achievement: 05/01/20 Potential to Achieve Goals: Good    Frequency Min 5X/week   Barriers to discharge        Co-evaluation               AM-PAC PT "6 Clicks" Mobility  Outcome Measure Help needed turning from your back to your side while in a flat bed without using bedrails?: A Little Help needed moving from lying on your back to sitting on the side of a flat bed without using bedrails?: A Little Help needed moving to and from a bed to a chair (including a wheelchair)?: A Little Help needed standing up from a chair using your arms (e.g., wheelchair or bedside chair)?: A Little Help needed to walk in hospital room?: A Little Help needed climbing 3-5 steps with a railing? : A Little 6 Click Score: 18    End of Session Equipment Utilized During Treatment: Back brace Activity Tolerance: Patient tolerated treatment well Patient left: in bed;with call bell/phone within reach;with family/visitor present Nurse Communication: Mobility status PT Visit Diagnosis: Other abnormalities of gait and mobility (R26.89);Difficulty in walking, not elsewhere classified (R26.2)    Time: 7867-5449 PT Time Calculation (min) (ACUTE ONLY): 20 min   Charges:   PT Evaluation $PT  Eval Low Complexity: 1 Low        Breahna Boylen E, PT Acute Rehabilitation Services Pager [phone]  Office [phone]   Leslie Jester D Elonda Husky 04/17/2020, 5:17 PM

## 2020-04-17 NOTE — Op Note (Signed)
Date: April 17, 2020  Preoperative diagnosis: Chronic lower back pain with radiculopathy  Postoperative diagnosis: Same  Procedure: Anterior spine exposure at the L5-S1 disc space through anterior retroperitoneal approach for L5-S1 ALIF  Surgeon: Dr. Marty Heck, MD  Co-surgeon: Dr. Kary Kos, MD  Assistant: Dr. Marjean Donna, MD  Indications: Patient is a 79 year old male with chronic lower back pain and radiculopathy.  He has had previous posterior instrumentation at L5-S1 and ultimately has had recurrent back pain.  He has failed conservative management.  He presents today for planned L5-S1 through anterior approach.  Findings: Transverse incision over the left rectus muscle and ultimately entered the retroperitoneal space and the peritoneum and left ureter were then mobilized across midline.  This did require ligating middle sacral vessels.  Ultimately the left iliac vein was fairly scarred given previous posterior instrumentation and this was mobilized for full exposure.  We inserted a spinal needle at L5-S1 to confirm we were at the correct level.  Anesthesia: General  Details: Patient was taken to the operating room after informed consent was obtained.  Placed on the operative table in supine position.  General endotracheal anesthesia was induced.  Fluoroscopic C-arm was used in lateral position to identify the L5-S1 disc space and this was marked over the left rectus muscle.  The abdomen was then prepped and draped in usual sterile fashion.  He did get preoperative antibiotics.  Timeout was performed to identify patient, procedure and site.  A transverse incision was made over the left rectus muscle.  Dissected through the subcutaneous tissue with Bovie cautery until we encountered the anterior rectus sheath.  Cerebellum retractors were used for added visualization.  The anterior rectus sheath was opened transversely as well with Bovie cautery.  Hemostats were used to raise  anterior flaps.  The left rectus muscle was circumferentially mobilized.  Ultimately the retroperitoneum was entered lateral to the rectus muscle and identified the peritoneum and left ureter.  We mobilized the peritoneum until we could identify the left psoas as well as left iliac vessels.  Dr. Stanford Breed used hand-held Wiley retractors to pull the peritoneum and left ureter across midline while we continue to mobilize the peritoneum bluntly with KD and suction and the anterior spine was visualized.  Ultimately the left iliac vein was visualized as well as the middle sacral vessels.  The middle sacral vessels had to be divided between clips and 2-0 silk ties.  Ultimately we then mobilized on each side of the disc space including extensive mobilization of the left iliac vein.  We got fixed Thompson retractor on the field and 150 reverse lips were placed on each side of the disc base as well as fixed malleable retractors cranial caudal.  We put a spinal needle in the disc space and confirmed on lateral fluoroscopy that we were at the correct L5-S1 level.  Case was then turned over to Dr. Saintclair Halsted.  Please see his dictation for the remainder of the case.  Complications: None  Condition: Stable  Marty Heck, MD [facility] Office: [place]

## 2020-04-17 NOTE — Transfer of Care (Signed)
Immediate Anesthesia Transfer of Care Note  Patient: Brent Hanna  Procedure(s) Performed: Anterior Lumbar Interbody Fusion  - Lumbar five-Sacral one (N/A ) ABDOMINAL EXPOSURE (N/A )  Patient Location: PACU  Anesthesia Type:General  Level of Consciousness: drowsy  Airway & Oxygen Therapy: Patient Spontanous Breathing and Patient connected to face mask oxygen  Post-op Assessment: Report given to RN and Post -op Vital signs reviewed and stable  Post vital signs: Reviewed and stable  Last Vitals:  Vitals Value Taken Time  BP 150/74 04/17/20 1054  Temp    Pulse 96 04/17/20 1055  Resp 18 04/17/20 1055  SpO2 100 % 04/17/20 1055  Vitals shown include unvalidated device data.  Last Pain:  Vitals:   04/17/20 0556  TempSrc:   PainSc: 0-No pain      Patients Stated Pain Goal: 6 (16/10/96 0454)  Complications: No complications documented.

## 2020-04-17 NOTE — H&P (Signed)
Brent Hanna is an 79 y.o. male.   Chief Complaint: Back left leg pain numbness HPI: 79 year old gentleman previously undergone L5-S1 posterior lateral fusion patient is a persistent back pain left leg pain numbness and tingling.  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  Due to patient's persistent clinical syndrome imaging findings failed conservative treatment I recommended anterior lumbar interbody fusion at L5-S1.  I have extensively gone over the risks and benefits of the operation with him as well as perioperative course expectations of outcome and alternatives of surgery and he understands and agrees to proceed forward.  Past Medical History:  Diagnosis Date  . Arthritis   . BPH (benign prostatic hyperplasia)   . Depression   . GERD (gastroesophageal reflux disease)    occasionally   . History of kidney stones   . Hypertension   . Mild back pain   . Numbness and tingling   . Pain    BACK PAIN - HAD BACK SURG OCT 2014 - STILL A LOT OF PAIN  . Pain in left shin    Lateral radiating to the bottom of left foot.  . 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    Past Surgical History:  Procedure Laterality Date  . APPENDECTOMY    . COLONOSCOPY W/ BIOPSIES AND POLYPECTOMY  2013  . EXTRACORPOREAL SHOCK WAVE LITHOTRIPSY Left 09/11/2017   Procedure: LEFT EXTRACORPOREAL SHOCK WAVE LITHOTRIPSY (ESWL);  Surgeon: Ceasar Mons, MD;  Location: [facility];  Service: Urology;  Laterality: Left;  . LAMINECTOMY WITH POSTERIOR LATERAL ARTHRODESIS LEVEL 1 Left 03/24/2013   Procedure: LEFT LUMBAR FIVE-SACRAL ONE LUMBAR LAMINECTOMY,FACETECTOMY,FORAMINOTOMY AND POSSIBLE MICRODISKECTOMY,POSTERIOR LATERAL ARTHRODESIS;  Surgeon: Hosie Spangle, MD;  Location: [facility];  Service: Neurosurgery;  Laterality: Left;  left  . NECK SURGERY Left 1978   piece of  metal removed, pt. reports he does have some metal in the neck  . TONSILLECTOMY    . TRANSURETHRAL RESECTION OF PROSTATE N/A 08/02/2013   Procedure: TRANSURETHRAL RESECTION OF THE PROSTATE WITH GYRUS INSTRUMENTS;  Surgeon: Ailene Rud, MD;  Location: [facility];  Service: Urology;  Laterality: N/A;    History reviewed. No pertinent family history. Social History:  reports that he has quit smoking. His smoking use included cigarettes. He has a 10.00 pack-year smoking history. He has never used smokeless tobacco. He reports current alcohol use. He reports that he does not use drugs.  Allergies: No Known Allergies  Medications Prior to Admission  Medication Sig Dispense Refill  . ibuprofen (ADVIL) 200 MG tablet Take 400 mg by mouth every 6 (six) hours as needed for headache or moderate pain.    Marland Kitchen losartan (COZAAR) 50 MG tablet Take 50 mg by mouth daily.     Marland Kitchen omeprazole (PRILOSEC OTC) 20 MG tablet Take 20 mg by mouth every other day.      No results found for this or any previous visit (from the past 48 hour(s)). No results found.  Review of Systems  Musculoskeletal: Positive for back pain.  Neurological: Positive for numbness.    Blood pressure (!) 161/75, pulse 95, temperature (!) 97.3 F (36.3 C), temperature source Temporal, resp. rate 18, height 5\' 10"  (1.778 m), weight 83 kg, SpO2 98 %. Physical Exam HENT:     Head: Normocephalic.     Right Ear: Tympanic membrane normal.     Nose:  Nose normal.     Mouth/Throat:     Mouth: Mucous membranes are moist.  Eyes:     Pupils: Pupils are equal, round, and reactive to light.  Cardiovascular:     Rate and Rhythm: Normal rate.     Pulses: Normal pulses.  Pulmonary:     Effort: Pulmonary effort is normal.  Abdominal:     General: Abdomen is flat.  Musculoskeletal:        General: Normal range of motion.  Skin:    General: Skin is warm.  Neurological:     General: No focal deficit present.     Mental Status: He is alert.      Comments: Is awake and alert strength is 5 out of 5 iliopsoas, quads, hamstrings, gastroc, tibialis, and EHL.      Assessment/Plan 79 year old presents for anterior lumbar interbody fusion L5-S1  Elaina Hoops, MD 04/17/2020, 7:23 AM

## 2020-04-18 ENCOUNTER — Encounter (HOSPITAL_COMMUNITY): Payer: Self-pay | Admitting: Neurosurgery

## 2020-04-18 DIAGNOSIS — Z87891 Personal history of nicotine dependence: Secondary | ICD-10-CM | POA: Diagnosis not present

## 2020-04-18 DIAGNOSIS — M960 Pseudarthrosis after fusion or arthrodesis: Secondary | ICD-10-CM | POA: Diagnosis not present

## 2020-04-18 DIAGNOSIS — I10 Essential (primary) hypertension: Secondary | ICD-10-CM | POA: Diagnosis not present

## 2020-04-18 MED ORDER — HYDROCODONE-ACETAMINOPHEN 5-325 MG PO TABS: 1.0000 | ORAL_TABLET | ORAL | 0 refills | Status: AC | PRN

## 2020-04-18 NOTE — Care Management Obs Status (Signed)
[place] NOTIFICATION   Patient Details  Name: DAJOUR PIERPOINT MRN: [id number] Date of Birth: 14-May-1941   Medicare Observation Status Notification Given:  Yes    Angelita Ingles, RN 04/18/2020, 8:56 AM

## 2020-04-18 NOTE — Progress Notes (Signed)
[facility]  Subjective  -tolerating liquids.  Overall feels good.  Has ambulated in the hall.   Objective 108/64 87 97.9 F (36.6 C) (Oral) 18 96%  Intake/Output Summary (Last 24 hours) at 04/18/2020 0626 Last data filed at 04/17/2020 1509 Gross per 24 hour  Intake 1620 ml  Output 725 ml  Net 895 ml    Only abdominal incisional tenderness Left DP 1+ palpable No significant lower extremity edema  Laboratory Lab Results: No results for input(s): WBC, HGB, HCT, PLT in the last 72 hours. BMET No results for input(s): NA, K, CL, CO2, GLUCOSE, BUN, CREATININE, CALCIUM in the last 72 hours.  COAG No results found for: INR, PROTIME No results found for: PTT  Assessment/Planning: POD #1 status post L5-S1 ALIF.  Overall looks very good this morning and has been tolerating liquids.  Abdomen is nice and soft other than expected postop incisional tenderness.  He has a palpable DP pulse in the left foot.  He has ambulated.  No concerns from vascular surgery and seems to be making good progress.  Marty Heck 04/18/2020 6:26 AM --

## 2020-04-18 NOTE — Care Management CC44 (Signed)
Condition Code 44 Documentation Completed  Patient Details  Name: ROSA GAMBALE MRN: [id number] Date of Birth: 15-Apr-1941   Condition Code 44 given:  Yes Patient signature on Condition Code 44 notice:  Yes Documentation of 2 MD's agreement:  Yes Code 44 added to claim:  Yes    Angelita Ingles, RN 04/18/2020, 8:56 AM

## 2020-04-18 NOTE — Discharge Summary (Signed)
Physician Discharge Summary  Patient ID: Brent Hanna MRN: [id number] DOB/AGE: 77/25/1942 77 y.o.  Admit date: 04/17/2020 Discharge date: 04/18/2020  Admission Diagnoses: Pseudoarthrosis L5-S1    Discharge Diagnoses: same   Discharged Condition: good  Hospital Course: The patient was admitted on 04/17/2020 and taken to the operating room where the patient underwent alif L5-S1. The patient tolerated the procedure well and was taken to the recovery room and then to the floor in stable condition. The hospital course was routine. There were no complications. The wound remained clean dry and intact. Pt had appropriate back soreness. No complaints of leg pain or new N/T/W. The patient remained afebrile with stable vital signs, and tolerated a regular diet. The patient continued to increase activities, and pain was well controlled with oral pain medications.   Consults: None  Significant Diagnostic Studies:  Results for orders placed or performed during the hospital encounter of 04/13/20  SARS CORONAVIRUS 2 (TAT 6-24 HRS) Nasopharyngeal Nasopharyngeal Swab   Specimen: Nasopharyngeal Swab  Result Value Ref Range   SARS Coronavirus 2 NEGATIVE NEGATIVE    DG Lumbar Spine 2-3 Views  Result Date: 04/17/2020 CLINICAL DATA:  L5-S1 ALIF EXAM: LUMBAR SPINE - 2-3 VIEW; DG C-ARM 1-60 MIN COMPARISON:  CT lumbar spine 02/29/2020 FLUOROSCOPY TIME:  0 minutes 37 seconds Dose: 30.80 mGy FINDINGS: Prior CT utilized standard lumbar numbering with 5 lumbar vertebra and last open disc space L5-S1. Submitted images demonstrate BILATERAL pedicle screws and posterior bars at L5-S1. Bones demineralized. Interval placement of anterior surgical hardware at L5-S1 with disc prosthesis. No fracture or subluxation. IMPRESSION: Prior PLIF L5-S1. New ALIF L5-S1 without acute abnormalities. Electronically Signed   By: Lavonia Dana M.D.   On: 04/17/2020 10:45   DG C-Arm 1-60 Min  Result Date: 04/17/2020 CLINICAL  DATA:  L5-S1 ALIF EXAM: LUMBAR SPINE - 2-3 VIEW; DG C-ARM 1-60 MIN COMPARISON:  CT lumbar spine 02/29/2020 FLUOROSCOPY TIME:  0 minutes 37 seconds Dose: 30.80 mGy FINDINGS: Prior CT utilized standard lumbar numbering with 5 lumbar vertebra and last open disc space L5-S1. Submitted images demonstrate BILATERAL pedicle screws and posterior bars at L5-S1. Bones demineralized. Interval placement of anterior surgical hardware at L5-S1 with disc prosthesis. No fracture or subluxation. IMPRESSION: Prior PLIF L5-S1. New ALIF L5-S1 without acute abnormalities. Electronically Signed   By: Lavonia Dana M.D.   On: 04/17/2020 10:45   DG OR LOCAL ABDOMEN  Result Date: 04/17/2020 CLINICAL DATA:  Surgical anterior fusion of L5-S1. EXAM: OR LOCAL ABDOMEN COMPARISON:  None. FINDINGS: The bowel gas pattern is normal. No radio-opaque calculi or other significant radiographic abnormality are seen. Status post surgical anterior and posterior fusion of L5-S1. No other radiopaque foreign body is noted. IMPRESSION: Status post surgical fusion of L5-S1. No other radiopaque foreign body is noted. These results were called by telephone at the time of interpretation on 04/17/2020 at 10:35 am to provider Ceasar Lund, who verbally acknowledged these results. Electronically Signed   By: Marijo Conception M.D.   On: 04/17/2020 10:36    Antibiotics:  Anti-infectives (From admission, onward)   Start     Dose/Rate Route Frequency Ordered Stop   04/17/20 1500  ceFAZolin (ANCEF) IVPB 2g/100 mL premix        2 g 200 mL/hr over 30 Minutes Intravenous Every 8 hours 04/17/20 1218 04/18/20 0013   04/17/20 0600  ceFAZolin (ANCEF) IVPB 2g/100 mL premix        2 g 200 mL/hr over 30 Minutes Intravenous  On call to O.R. 04/17/20 0547 04/17/20 0817      Discharge Exam: Blood pressure 108/64, pulse 87, temperature 97.9 F (36.6 C), temperature source Oral, resp. rate 18, height 5\' 10"  (1.778 m), weight 83 kg, SpO2 96 %. Neurologic: Grossly  normal Ambulating and voiding well, incision cdi  Discharge Medications:   Allergies as of 04/18/2020   No Known Allergies     Medication List    TAKE these medications   HYDROcodone-acetaminophen 5-325 MG tablet Commonly known as: NORCO/VICODIN Take 1 tablet by mouth every 4 (four) hours as needed for moderate pain.   ibuprofen 200 MG tablet Commonly known as: ADVIL Take 400 mg by mouth every 6 (six) hours as needed for headache or moderate pain.   losartan 50 MG tablet Commonly known as: COZAAR Take 50 mg by mouth daily.   omeprazole 20 MG tablet Commonly known as: PRILOSEC OTC Take 20 mg by mouth every other day.       Disposition: home   Final Dx: ALIF L5-S1  Discharge Instructions     Remove dressing in 72 hours   Complete by: As directed    Call MD for:  difficulty breathing, headache or visual disturbances   Complete by: As directed    Call MD for:  hives   Complete by: As directed    Call MD for:  persistant dizziness or light-headedness   Complete by: As directed    Call MD for:  persistant nausea and vomiting   Complete by: As directed    Call MD for:  redness, tenderness, or signs of infection (pain, swelling, redness, odor or green/yellow discharge around incision site)   Complete by: As directed    Call MD for:  severe uncontrolled pain   Complete by: As directed    Call MD for:  temperature >100.4   Complete by: As directed    Diet - low sodium heart healthy   Complete by: As directed    Driving Restrictions   Complete by: As directed    No driving for 2 weeks, no riding in the car for 1 week   Increase activity slowly   Complete by: As directed    Lifting restrictions   Complete by: As directed    No lifting more than 8 lbs         Signed: Ocie Cornfield Rayne Loiseau 04/18/2020, 7:46 AM

## 2020-04-18 NOTE — Progress Notes (Signed)
Pt doing well. Pt and wife given D/C instructions with verbal understanding. Rx was e-prescribed to the Pt's pharmacy by MD. Pt's incision is clean and dry with no sign of infection. Pt's IV was removed prior to D/C. Pt D/C'd home via wheelchair per MD order. Pt is stable @ D/C and has no other needs at this time. Holli Humbles, RN

## 2020-04-18 NOTE — Progress Notes (Signed)
Physical Therapy Treatment Patient Details Name: Brent Hanna MRN: [id number] DOB: 04-Aug-1940 Today's Date: 04/18/2020    History of Present Illness 79 yo male s/p L5-S1 ALIF on 04/17/20, after fall in 08/2019 resulting in increased back pain and LLE radiculopathy. PMH Includes L lumbar laminectomy L5-S1, HTN.    PT Comments    Pt progressing well with post-op mobility. He was able to demonstrate transfers and ambulation with gross modified independence and no AD. Pt was educated on precautions, brace application/wearing schedule, appropriate activity progression, and car transfer. Will continue to follow.      Follow Up Recommendations  Supervision for mobility/OOB     Equipment Recommendations  None recommended by PT    Recommendations for Other Services       Precautions / Restrictions Precautions Precautions: Fall;Back Precaution Booklet Issued: Yes (comment) Precaution Comments: Reviewed handout with pt and was cued for precautions during functional mobility.  Required Braces or Orthoses: Spinal Brace Spinal Brace: Lumbar corset;Applied in sitting position Restrictions Weight Bearing Restrictions: No    Mobility  Bed Mobility Overal bed mobility: Modified Independent Bed Mobility: Rolling;Sidelying to Sit           General bed mobility comments: HOB flat and rails lowered to simulate home environment.   Transfers Overall transfer level: Modified independent Equipment used: None Transfers: Sit to/from Stand           General transfer comment: Pt maintaining good posture as he powered up to full standing position. No assist required.   Ambulation/Gait Ambulation/Gait assistance: Modified independent (Device/Increase time) Gait Distance (Feet): 25 Feet Assistive device: None Gait Pattern/deviations: Step-through pattern;Decreased stride length;Trunk flexed Gait velocity: Decreased Gait velocity interpretation: 1.31 - 2.62 ft/sec, indicative of  limited community ambulator General Gait Details: In room only as I observed pt ambulating multiple laps around the unit prior to PT session at a mod I level.    Stairs             Wheelchair Mobility    Modified Rankin (Stroke Patients Only)       Balance Overall balance assessment: Mild deficits observed, not formally tested                                          Cognition Arousal/Alertness: Awake/alert Behavior During Therapy: WFL for tasks assessed/performed Overall Cognitive Status: Within Functional Limits for tasks assessed                                        Exercises      General Comments        Pertinent Vitals/Pain Pain Assessment: Faces Faces Pain Scale: Hurts a little bit Pain Location: abdomen from surgical incision and gas Pain Descriptors / Indicators: Discomfort Pain Intervention(s): Limited activity within patient's tolerance;Monitored during session;Repositioned    Home Living Family/patient expects to be discharged to:: Private residence Living Arrangements: Spouse/significant other Available Help at Discharge: Family;Available 24 hours/day Type of Home: Independent living facility Home Access: [place]: One level Home Equipment: Grab bars - tub/shower;Grab bars - toilet;Shower seat      Prior Function Level of Independence: Independent          PT Goals (current goals can now be found in the care plan section) Acute Rehab PT  Goals Patient Stated Goal: to play golf again PT Goal Formulation: With patient Time For Goal Achievement: 05/01/20 Potential to Achieve Goals: Good Progress towards PT goals: Progressing toward goals    Frequency    Min 5X/week      PT Plan Current plan remains appropriate    Co-evaluation              AM-PAC PT "6 Clicks" Mobility   Outcome Measure  Help needed turning from your back to your side while in a flat bed without using  bedrails?: None Help needed moving from lying on your back to sitting on the side of a flat bed without using bedrails?: None Help needed moving to and from a bed to a chair (including a wheelchair)?: None Help needed standing up from a chair using your arms (e.g., wheelchair or bedside chair)?: None Help needed to walk in hospital room?: None Help needed climbing 3-5 steps with a railing? : None 6 Click Score: 24    End of Session Equipment Utilized During Treatment: Back brace Activity Tolerance: Patient tolerated treatment well Patient left: in bed;with call bell/phone within reach;with family/visitor present Nurse Communication: Mobility status PT Visit Diagnosis: Other abnormalities of gait and mobility (R26.89);Difficulty in walking, not elsewhere classified (R26.2)     Time: 2956-2130 PT Time Calculation (min) (ACUTE ONLY): 10 min  Charges:  $Gait Training: 8-22 mins                     Rolinda Roan, PT, DPT Acute Rehabilitation Services Pager: [phone] Office: [phone]    Thelma Comp 04/18/2020, 10:18 AM

## 2020-04-18 NOTE — Progress Notes (Signed)
Occupational Therapy Evaluation Patient Details Name: Brent Hanna MRN: [id number] DOB: Jan 22, 1941 Today's Date: 04/18/2020    History of Present Illness 79 yo male s/p L5-S1 ALIF on 04/17/20, after fall in 08/2019 resulting in increased back pain and LLE radiculopathy. PMH Includes L lumbar laminectomy L5-S1, HTN.   Clinical Impression   Completed education regarding back precautions for ADL and functional mobility for ADL using compensatory strategies, AE and DME. Pt verbalized/demonstrated understanding. No further OT needed.    Follow Up Recommendations  No OT follow up    Equipment Recommendations  None recommended by OT    Recommendations for Other Services       Precautions / Restrictions Precautions Precautions: Fall;Back Precaution Booklet Issued: Yes (comment) Precaution Comments: no bending, lifting, twisting, arching Required Braces or Orthoses: Spinal Brace Spinal Brace: Lumbar corset;Applied in sitting position      Mobility Bed Mobility Overal bed mobility: Modified Independent (after education regarding correct technique)                  Transfers Overall transfer level: Modified independent                    Balance Overall balance assessment: Mild deficits observed, not formally tested                                         ADL either performed or assessed with clinical judgement   ADL Overall ADL's : Needs assistance/impaired                                     Functional mobility during ADLs: Modified independent General ADL Comments: Educated pt on back precautions regarding ADL and mobility. Able to complete figure four positioning. Educated on [employer] to obtain items from floor. Recommend pt use his showe seat and sit to bath to reduce risk of falls. Educated on pericare and compensatory stragtegies for grooming. Educaetd on home set up to increase adherance to back precautions. Pt  verbalized understanding.     Vision         Perception     Praxis      Pertinent Vitals/Pain Pain Assessment: Faces Faces Pain Scale: Hurts a little bit Pain Location: abdomen from surgical incision and gas Pain Descriptors / Indicators: Discomfort Pain Intervention(s): Limited activity within patient's tolerance     Hand Dominance     Extremity/Trunk Assessment Upper Extremity Assessment Upper Extremity Assessment: Overall WFL for tasks assessed   Lower Extremity Assessment Lower Extremity Assessment: Defer to PT evaluation (L foot numbness)   Cervical / Trunk Assessment Cervical / Trunk Assessment: Other exceptions (back sx)   Communication     Cognition Arousal/Alertness: Awake/alert Behavior During Therapy: WFL for tasks assessed/performed Overall Cognitive Status: Within Functional Limits for tasks assessed                                     General Comments       Exercises     Shoulder Instructions      Home Living Family/patient expects to be discharged to:: Private residence Living Arrangements: Spouse/significant other Available Help at Discharge: Family;Available 24 hours/day Type of Home: Independent living facility Home Access: Elevator  Home Layout: One level     Bathroom Shower/Tub: Occupational psychologist: Handicapped height Bathroom Accessibility: Yes How Accessible: Accessible via wheelchair Home Equipment: Grab bars - tub/shower;Grab bars - toilet;Shower seat          Prior Functioning/Environment Level of Independence: Independent                 OT Problem List: Decreased knowledge of use of DME or AE;Decreased knowledge of precautions;Pain      OT Treatment/Interventions:      OT Goals(Current goals can be found in the care plan section) Acute Rehab OT Goals Patient Stated Goal: to play golf again OT Goal Formulation: All assessment and education complete, DC therapy  OT Frequency:      Barriers to D/C:            Co-evaluation              AM-PAC OT "6 Clicks" Daily Activity     Outcome Measure Help from another person eating meals?: None Help from another person taking care of personal grooming?: None Help from another person toileting, which includes using toliet, bedpan, or urinal?: None Help from another person bathing (including washing, rinsing, drying)?: None Help from another person to put on and taking off regular upper body clothing?: None Help from another person to put on and taking off regular lower body clothing?: None 6 Click Score: 24   End of Session Equipment Utilized During Treatment: Back brace Nurse Communication: Mobility status  Activity Tolerance: Patient tolerated treatment well Patient left: Other (comment) (walking in hall)  OT Visit Diagnosis: Muscle weakness (generalized) (M62.81);Other abnormalities of gait and mobility (R26.89);Pain Pain - part of body:  (back)                Time: 1607-3710 OT Time Calculation (min): 17 min Charges:  OT General Charges $OT Visit: 1 Visit OT Evaluation $OT Eval Low Complexity: [place], OT/L   Acute OT Clinical Specialist [place] Pager [phone] Office [phone]   [facility] 04/18/2020, 9:14 AM

## 2020-04-20 ENCOUNTER — Emergency Department (HOSPITAL_COMMUNITY)
Admission: EM | Admit: 2020-04-20 | Discharge: 2020-04-20 | Disposition: A | Discharge status: Home / Self Care | Payer: Medicare Other | Attending: Emergency Medicine | Admitting: Emergency Medicine

## 2020-04-20 DIAGNOSIS — R339 Retention of urine, unspecified: Secondary | ICD-10-CM | POA: Insufficient documentation

## 2020-04-20 DIAGNOSIS — Z79899 Other long term (current) drug therapy: Secondary | ICD-10-CM | POA: Insufficient documentation

## 2020-04-20 DIAGNOSIS — Z87891 Personal history of nicotine dependence: Secondary | ICD-10-CM | POA: Insufficient documentation

## 2020-04-20 DIAGNOSIS — I10 Essential (primary) hypertension: Secondary | ICD-10-CM | POA: Insufficient documentation

## 2020-04-20 LAB — URINALYSIS, ROUTINE W REFLEX MICROSCOPIC
Bacteria, UA: NONE SEEN
Bilirubin Urine: NEGATIVE
Glucose, UA: NEGATIVE mg/dL
Ketones, ur: 5 mg/dL — AB
Leukocytes,Ua: NEGATIVE
Nitrite: NEGATIVE
Protein, ur: NEGATIVE mg/dL
Specific Gravity, Urine: 1.008 (ref 1.005–1.030)
pH: 7 (ref 5.0–8.0)

## 2020-04-20 MED FILL — Heparin Sodium (Porcine) Inj 1000 Unit/ML: INTRAMUSCULAR | Qty: 30 | Status: AC

## 2020-04-20 MED FILL — Sodium Chloride IV Soln 0.9%: INTRAVENOUS | Qty: 1000 | Status: AC

## 2020-04-20 NOTE — Discharge Instructions (Addendum)
Mr. Brent Hanna, thank you for visiting [facility] Emergency department. You had a urinary retention and we were able to take out 500 mL of urine out of your bladder. We are leaving the Foley in place and we want you to follow-up with your urologist next week. We are sending your urine for analysis and culture. If it is infected we will call in antibiotics for you. Make sure to leave the Foley in place until you see your urologist next week.

## 2020-04-20 NOTE — ED Triage Notes (Signed)
Pt states he had laproscopic back surgery 11/22 and has had very little urine output. Pt states it burns and he "dribbles".   VS  BP- 181/96 O2-99 RA  P-110

## 2020-04-20 NOTE — ED Provider Notes (Signed)
[facility] EMERGENCY DEPARTMENT Provider Note   CSN: [id number] Arrival date & time: 04/20/20  0756    History Chief Complaint  Patient presents with  . Urinary Retention    Brent Hanna is a 79 y.o. male with PMH of radiculopathy, hypertension, GERD, depression, BPH status post TURP in 2015 presented to the ED with a complaint of urinary retention. Patient was recently admitted for pseudoarthrosis L5-S1 and had anterior lumbar interbody fusion of L5-S1. Patient experienced some urinary retention and dysuria during hospitalization. At home, he has had decreased urine output "dribbling" and burning with urination. This AM, he was unable to urinate and experienced pain in his bladder. Patient denies any N/V, fever/chills, abd pain, SOB, CP or leg pain.     Past Medical History:  Diagnosis Date  . Arthritis   . BPH (benign prostatic hyperplasia)   . Depression   . GERD (gastroesophageal reflux disease)    occasionally   . History of kidney stones   . Hypertension   . Mild back pain   . Numbness and tingling   . Pain    BACK PAIN - HAD BACK SURG OCT 2014 - STILL A LOT OF PAIN  . Pain in left shin    Lateral radiating to the bottom of left foot.  . 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    Patient Active Problem List   Diagnosis Date Noted  . 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 Chronic back pain 03/28/2020  . Hematuria, gross 08/04/2013  . Syncope 08/03/2013  . Benign prostate hyperplasia 08/02/2013    Past Surgical History:  Procedure Laterality Date  . ABDOMINAL EXPOSURE N/A 04/17/2020   Procedure: ABDOMINAL EXPOSURE;  Surgeon: Marty Heck, MD;  Location: [facility];  Service: Vascular;  Laterality: N/A;  . ANTERIOR LUMBAR FUSION N/A 04/17/2020   Procedure: Anterior Lumbar Interbody Fusion  - Lumbar five-Sacral one;  Surgeon: Kary Kos, MD;  Location: [facility];  Service: Neurosurgery;   Laterality: N/A;  . APPENDECTOMY    . COLONOSCOPY W/ BIOPSIES AND POLYPECTOMY  2013  . EXTRACORPOREAL SHOCK WAVE LITHOTRIPSY Left 09/11/2017   Procedure: LEFT EXTRACORPOREAL SHOCK WAVE LITHOTRIPSY (ESWL);  Surgeon: Ceasar Mons, MD;  Location: [facility];  Service: Urology;  Laterality: Left;  . LAMINECTOMY WITH POSTERIOR LATERAL ARTHRODESIS LEVEL 1 Left 03/24/2013   Procedure: LEFT LUMBAR FIVE-SACRAL ONE LUMBAR LAMINECTOMY,FACETECTOMY,FORAMINOTOMY AND POSSIBLE MICRODISKECTOMY,POSTERIOR LATERAL ARTHRODESIS;  Surgeon: Hosie Spangle, MD;  Location: [facility];  Service: Neurosurgery;  Laterality: Left;  left  . NECK SURGERY Left 1978   piece of metal removed, pt. reports he does have some metal in the neck  . TONSILLECTOMY    . TRANSURETHRAL RESECTION OF PROSTATE N/A 08/02/2013   Procedure: TRANSURETHRAL RESECTION OF THE PROSTATE WITH GYRUS INSTRUMENTS;  Surgeon: Ailene Rud, MD;  Location: [facility];  Service: Urology;  Laterality: N/A;       No family history on file.  Social History   Tobacco Use  . Smoking status: Former Smoker    Packs/day: 1.00    Years: 10.00    Pack years: 10.00    Types: Cigarettes  . Smokeless tobacco: Never Used  . Tobacco comment: quit 30 yrs. ago  Vaping Use  . Vaping Use: Never used  Substance Use Topics  . Alcohol use: Yes    Comment: occasionally ALCOHOL  -  QUIT SMOKING WHEN 4O SOMETHING YRS OLD  .  Drug use: No    Home Medications Prior to Admission medications   Medication Sig Start Date End Date Taking? Authorizing Provider  HYDROcodone-acetaminophen (NORCO/VICODIN) 5-325 MG tablet Take 1 tablet by mouth every 4 (four) hours as needed for moderate pain. 04/18/20 04/18/21  Meyran, Ocie Cornfield, NP  ibuprofen (ADVIL) 200 MG tablet Take 400 mg by mouth every 6 (six) hours as needed for headache or moderate pain.    [provider]  losartan (COZAAR) 50 MG tablet Take 50 mg by mouth daily.     [provider]  omeprazole (PRILOSEC OTC) 20 MG tablet Take 20 mg by mouth every other day.    [provider]    Allergies    Patient has no known allergies.  Review of Systems   Review of Systems  Constitutional: Negative for chills and fever.  Respiratory: Negative for cough and shortness of breath.   Cardiovascular: Negative for chest pain and palpitations.  Gastrointestinal: Positive for abdominal distention. Negative for abdominal pain, nausea and vomiting.  Genitourinary: Positive for difficulty urinating and dysuria. Negative for hematuria.  Musculoskeletal: Positive for back pain.  Skin: Negative for rash.  Neurological: Negative for dizziness, syncope and headaches.  Psychiatric/Behavioral: Negative for agitation.    Physical Exam Updated Vital Signs BP (!) 181/96 (BP Location: Right Arm)   Pulse (!) 110   Temp 98.2 F (36.8 C) (Oral)   Resp 20   SpO2 99%   Physical Exam Constitutional:      General: He is in acute distress.     Appearance: He is not ill-appearing.  HENT:     Head: Normocephalic and atraumatic.     Nose: Nose normal.  Eyes:     Conjunctiva/sclera: Conjunctivae normal.  Cardiovascular:     Rate and Rhythm: Normal rate and regular rhythm.     Pulses: Normal pulses.     Heart sounds: Normal heart sounds.  Pulmonary:     Effort: Pulmonary effort is normal.     Breath sounds: Normal breath sounds.  Abdominal:     General: Abdomen is flat. Bowel sounds are normal. There is distension.     Tenderness: There is no abdominal tenderness.     Comments: Bladder distention.   Musculoskeletal:        General: Normal range of motion.     Cervical back: Normal range of motion.  Skin:    General: Skin is warm and dry.  Neurological:     General: No focal deficit present.     Mental Status: He is alert and oriented to person, place, and time.     Motor: No weakness.  Psychiatric:        Mood and Affect: Mood normal.     ED Results / Procedures /  Treatments   Labs (all labs ordered are listed, but only abnormal results are displayed) Labs Reviewed  URINE CULTURE  URINALYSIS, ROUTINE W REFLEX MICROSCOPIC    EKG None  Radiology No results found.  Procedures Procedures (including critical care time)  Medications Ordered in ED Medications - No data to display  ED Course  I have reviewed the triage vital signs and the nursing notes.  Pertinent labs & imaging results that were available during my care of the patient were reviewed by me and considered in my medical decision making (see chart for details).    MDM Rules/Calculators/A&P  79 yo M patient w/ significant hx of BPH s/p TURB and recent back surgery here for an evaluation of urinary retention and difficulty with urination. Patient found to be hypertensive but afebrile on arrival. Distended bladder on exam. Unable to estimate bladder volume from bladder scan. Foley placed with 500 cc of urine output with significant relief of pain. Urine sent for urinalysis and urine culture. Discharging home with foley in with instructions to follow up with Urologist on Monday. Will follow up UA and urine culture.   Final Clinical Impression(s) / ED Diagnoses Final diagnoses:  Urinary retention    Rx / DC Orders ED Discharge Orders    None       Lacinda Axon, MD 04/20/20 [id number]    Isla Pence, MD 04/20/20 [phone]

## 2020-04-21 LAB — URINE CULTURE: Culture: NO GROWTH

## 2020-04-24 DIAGNOSIS — R338 Other retention of urine: Secondary | ICD-10-CM | POA: Diagnosis not present

## 2020-04-27 DIAGNOSIS — R338 Other retention of urine: Secondary | ICD-10-CM | POA: Diagnosis not present

## 2020-05-18 DIAGNOSIS — H2512 Age-related nuclear cataract, left eye: Secondary | ICD-10-CM | POA: Diagnosis not present

## 2020-05-18 DIAGNOSIS — H52203 Unspecified astigmatism, bilateral: Secondary | ICD-10-CM | POA: Diagnosis not present

## 2020-06-15 DIAGNOSIS — R338 Other retention of urine: Secondary | ICD-10-CM | POA: Diagnosis not present

## 2020-06-22 DIAGNOSIS — H25812 Combined forms of age-related cataract, left eye: Secondary | ICD-10-CM | POA: Diagnosis not present

## 2020-06-22 DIAGNOSIS — H2512 Age-related nuclear cataract, left eye: Secondary | ICD-10-CM | POA: Diagnosis not present

## 2020-07-11 DIAGNOSIS — S32009K Unspecified fracture of unspecified lumbar vertebra, subsequent encounter for fracture with nonunion: Secondary | ICD-10-CM | POA: Diagnosis not present

## 2020-07-11 DIAGNOSIS — I10 Essential (primary) hypertension: Secondary | ICD-10-CM | POA: Diagnosis not present

## 2020-07-11 DIAGNOSIS — M5416 Radiculopathy, lumbar region: Secondary | ICD-10-CM | POA: Diagnosis not present

## 2020-07-11 DIAGNOSIS — Z6826 Body mass index (BMI) 26.0-26.9, adult: Secondary | ICD-10-CM | POA: Diagnosis not present

## 2020-07-31 DIAGNOSIS — M5416 Radiculopathy, lumbar region: Secondary | ICD-10-CM | POA: Diagnosis not present

## 2020-08-29 DIAGNOSIS — I10 Essential (primary) hypertension: Secondary | ICD-10-CM | POA: Diagnosis not present

## 2020-08-29 DIAGNOSIS — M5136 Other intervertebral disc degeneration, lumbar region: Secondary | ICD-10-CM | POA: Diagnosis not present

## 2020-08-29 DIAGNOSIS — Z125 Encounter for screening for malignant neoplasm of prostate: Secondary | ICD-10-CM | POA: Diagnosis not present

## 2020-09-05 DIAGNOSIS — I10 Essential (primary) hypertension: Secondary | ICD-10-CM | POA: Diagnosis not present

## 2020-09-05 DIAGNOSIS — R82998 Other abnormal findings in urine: Secondary | ICD-10-CM | POA: Diagnosis not present

## 2020-09-05 DIAGNOSIS — Z1331 Encounter for screening for depression: Secondary | ICD-10-CM | POA: Diagnosis not present

## 2020-09-05 DIAGNOSIS — Z0000 Encounter for general adult medical examination without abnormal findings: Secondary | ICD-10-CM | POA: Diagnosis not present

## 2020-09-05 DIAGNOSIS — D72819 Decreased white blood cell count, unspecified: Secondary | ICD-10-CM | POA: Diagnosis not present

## 2020-09-05 DIAGNOSIS — I700 Atherosclerosis of aorta: Secondary | ICD-10-CM | POA: Diagnosis not present

## 2020-09-05 DIAGNOSIS — I493 Ventricular premature depolarization: Secondary | ICD-10-CM | POA: Diagnosis not present

## 2020-09-05 DIAGNOSIS — I6529 Occlusion and stenosis of unspecified carotid artery: Secondary | ICD-10-CM | POA: Diagnosis not present

## 2020-09-05 DIAGNOSIS — M5416 Radiculopathy, lumbar region: Secondary | ICD-10-CM | POA: Diagnosis not present

## 2020-09-05 DIAGNOSIS — M1990 Unspecified osteoarthritis, unspecified site: Secondary | ICD-10-CM | POA: Diagnosis not present

## 2020-09-05 DIAGNOSIS — Z1339 Encounter for screening examination for other mental health and behavioral disorders: Secondary | ICD-10-CM | POA: Diagnosis not present

## 2020-09-12 DIAGNOSIS — Z1212 Encounter for screening for malignant neoplasm of rectum: Secondary | ICD-10-CM | POA: Diagnosis not present

## 2020-10-02 DIAGNOSIS — Z23 Encounter for immunization: Secondary | ICD-10-CM | POA: Diagnosis not present

## 2020-11-12 ENCOUNTER — Other Ambulatory Visit: Payer: Self-pay

## 2020-11-12 ENCOUNTER — Encounter (HOSPITAL_BASED_OUTPATIENT_CLINIC_OR_DEPARTMENT_OTHER): Payer: Self-pay | Admitting: Emergency Medicine

## 2020-11-12 ENCOUNTER — Emergency Department (HOSPITAL_BASED_OUTPATIENT_CLINIC_OR_DEPARTMENT_OTHER)
Admission: EM | Admit: 2020-11-12 | Discharge: 2020-11-12 | Disposition: A | Discharge status: Home / Self Care | Payer: Medicare Other | Attending: Emergency Medicine | Admitting: Emergency Medicine

## 2020-11-12 DIAGNOSIS — Z87891 Personal history of nicotine dependence: Secondary | ICD-10-CM | POA: Diagnosis not present

## 2020-11-12 DIAGNOSIS — U071 COVID-19: Secondary | ICD-10-CM | POA: Diagnosis not present

## 2020-11-12 DIAGNOSIS — Z79899 Other long term (current) drug therapy: Secondary | ICD-10-CM | POA: Diagnosis not present

## 2020-11-12 DIAGNOSIS — I10 Essential (primary) hypertension: Secondary | ICD-10-CM | POA: Insufficient documentation

## 2020-11-12 DIAGNOSIS — R059 Cough, unspecified: Secondary | ICD-10-CM | POA: Diagnosis present

## 2020-11-12 DIAGNOSIS — Z20822 Contact with and (suspected) exposure to covid-19: Secondary | ICD-10-CM

## 2020-11-12 LAB — BASIC METABOLIC PANEL
Anion gap: 6 (ref 5–15)
BUN: 17 mg/dL (ref 8–23)
CO2: 26 mmol/L (ref 22–32)
Calcium: 9.3 mg/dL (ref 8.9–10.3)
Chloride: 106 mmol/L (ref 98–111)
Creatinine, Ser: 0.87 mg/dL (ref 0.61–1.24)
GFR, Estimated: >60 mL/min (ref >60)
Glucose, Bld: 121 mg/dL — ABNORMAL HIGH (ref 70–99)
Potassium: 4 mmol/L (ref 3.5–5.1)
Sodium: 138 mmol/L (ref 135–145)

## 2020-11-12 LAB — RESP PANEL BY RT-PCR (FLU A&B, COVID) ARPGX2
Influenza A by PCR: NEGATIVE
Influenza B by PCR: NEGATIVE
SARS Coronavirus 2 by RT PCR: POSITIVE — AB

## 2020-11-12 MED ORDER — NIRMATRELVIR/RITONAVIR (PAXLOVID)TABLET: 3.0000 | ORAL_TABLET | Freq: Two times a day (BID) | ORAL | 0 refills | Status: AC

## 2020-11-12 NOTE — Discharge Instructions (Addendum)
When your COVID test comes back if it is positive you can start the antiviral therapy.

## 2020-11-12 NOTE — ED Provider Notes (Signed)
[facility] EMERGENCY DEPARTMENT Provider Note   CSN: [id number] Arrival date & time: 11/12/20  0841     History Chief Complaint  Patient presents with   Sore Throat    Brent Hanna is a 80 y.o. male.  The history is provided by the patient.  Sore Throat This is a new problem. The current episode started 6 to 12 hours ago. The problem occurs constantly. The problem has not changed since onset.Associated symptoms comments: Headache, general malaise and mild cough. Nothing aggravates the symptoms. Nothing relieves the symptoms. He has tried nothing (Significant other at home who tested positive for COVID today) for the symptoms. The treatment provided no relief.      Past Medical History:  Diagnosis Date   Arthritis    BPH (benign prostatic hyperplasia)    Depression    GERD (gastroesophageal reflux disease)    occasionally    History of kidney stones    Hypertension    Mild back pain    Numbness and tingling    Pain    BACK PAIN - HAD BACK SURG OCT 2014 - STILL A LOT OF PAIN   Pain in left shin    Lateral radiating to the bottom of left foot.   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   Chronic back pain 03/28/2020   Hematuria, gross 08/04/2013   Syncope 08/03/2013   Benign prostate hyperplasia 08/02/2013    Past Surgical History:  Procedure Laterality Date   ABDOMINAL EXPOSURE N/A 04/17/2020   Procedure: ABDOMINAL EXPOSURE;  Surgeon: Marty Heck, MD;  Location: [facility];  Service: Vascular;  Laterality: N/A;   ANTERIOR LUMBAR FUSION N/A 04/17/2020   Procedure: Anterior Lumbar Interbody Fusion  - Lumbar five-Sacral one;  Surgeon: Kary Kos, MD;  Location: [facility];  Service: Neurosurgery;  Laterality: N/A;   APPENDECTOMY     COLONOSCOPY W/ BIOPSIES AND POLYPECTOMY  2013   EXTRACORPOREAL SHOCK WAVE  LITHOTRIPSY Left 09/11/2017   Procedure: LEFT EXTRACORPOREAL SHOCK WAVE LITHOTRIPSY (ESWL);  Surgeon: Ceasar Mons, MD;  [facility]: [facility];  Service: Urology;  Laterality: Left;   LAMINECTOMY WITH POSTERIOR LATERAL ARTHRODESIS LEVEL 1 Left 03/24/2013   Procedure: LEFT LUMBAR FIVE-SACRAL ONE LUMBAR LAMINECTOMY,FACETECTOMY,FORAMINOTOMY AND POSSIBLE MICRODISKECTOMY,POSTERIOR LATERAL ARTHRODESIS;  Surgeon: Hosie Spangle, MD;  Location: [facility];  Service: Neurosurgery;  Laterality: Left;  left   NECK SURGERY Left 1978   piece of metal removed, pt. reports he does have some metal in the neck   TONSILLECTOMY     TRANSURETHRAL RESECTION OF PROSTATE N/A 08/02/2013   Procedure: TRANSURETHRAL RESECTION OF THE PROSTATE WITH GYRUS INSTRUMENTS;  Surgeon: Ailene Rud, MD;  [facility];  Service: Urology;  Laterality: N/A;       History reviewed. No pertinent family history.  Social History   Tobacco Use   Smoking status: Former    Packs/day: 1.00    Years: 10.00    Pack years: 10.00    Types: Cigarettes   Smokeless tobacco: Never   Tobacco comments:    quit 30 yrs. ago  Vaping Use   Vaping Use: Never used  Substance Use Topics   Alcohol use: Yes    Comment: occasionally ALCOHOL  -  QUIT SMOKING WHEN 4O SOMETH[age] YRS OLD   Drug use: No    Home  Medications Prior to Admission medications   Medication Sig Start Date End Date Taking? Authorizing Provider  HYDROcodone-acetaminophen (NORCO/VICODIN) 5-325 MG tablet Take 1 tablet by mouth every 4 (four) hours as needed for moderate pain. 04/18/20 04/18/21  Meyran, Ocie Cornfield, NP  ibuprofen (ADVIL) 200 MG tablet Take 400 mg by mouth every 6 (six) hours as needed for headache or moderate pain.    [provider]  losartan (COZAAR) 50 MG tablet Take 50 mg by mouth daily.     [provider]  omeprazole (PRILOSEC OTC) 20 MG tablet Take 20 mg by mouth every other day.    [provider]     Allergies    Patient has no known allergies.  Review of Systems   Review of Systems  All other systems reviewed and are negative.  Physical Exam Updated Vital Signs BP (!) 187/87   Pulse 86   Temp 98.4 F (36.9 C) (Oral)   Resp 17   Ht 5\' 9"  (1.753 m)   Wt 81.6 kg   SpO2 95%   BMI 26.58 kg/m   Physical Exam Vitals and nursing note reviewed.  Constitutional:      General: He is not in acute distress.    Appearance: He is well-developed.  HENT:     Head: Normocephalic and atraumatic.     Nose: No congestion.     Mouth/Throat:     Mouth: Mucous membranes are moist.     Pharynx: Posterior oropharyngeal erythema present. No oropharyngeal exudate.  Eyes:     Conjunctiva/sclera: Conjunctivae normal.     Pupils: Pupils are equal, round, and reactive to light.  Cardiovascular:     Rate and Rhythm: Normal rate and regular rhythm.     Heart sounds: No murmur heard. Pulmonary:     Effort: Pulmonary effort is normal. No respiratory distress.     Breath sounds: Normal breath sounds. No wheezing or rales.  Abdominal:     General: There is no distension.     Palpations: Abdomen is soft.     Tenderness: There is no abdominal tenderness. There is no guarding or rebound.  Musculoskeletal:        General: No tenderness. Normal range of motion.     Cervical back: Normal range of motion and neck supple.  Skin:    General: Skin is warm and dry.     Findings: No erythema or rash.  Neurological:     Mental Status: He is alert and oriented to person, place, and time.  Psychiatric:        Behavior: Behavior normal.    ED Results / Procedures / Treatments   Labs (all labs ordered are listed, but only abnormal results are displayed) Labs Reviewed  BASIC METABOLIC PANEL - Abnormal; Notable for the following components:      Result Value   Glucose, Bld 121 (*)    All other components within normal limits  RESP PANEL BY RT-PCR (FLU A&B, COVID) ARPGX2     EKG None  Radiology No results found.  Procedures Procedures   Medications Ordered in ED Medications - No data to display  ED Course  I have reviewed the triage vital signs and the nursing notes.  Pertinent labs & imaging results that were available during my care of the patient were reviewed by me and considered in my medical decision making (see chart for details).    MDM Rules/Calculators/A&P  Pt with symptoms consistent with viral URI vs COVID but significant other testing positive for covid today with same sx.  Pt has had vaccine and 2 boosters.  Well appearing here.  No signs of breathing difficulty  No signs of pharyngitis, otitis or abnormal abdominal findings.   GRF of 60 and COVID pending.  Patient given a prescription for PAxlovid to fill if COVID positive.  MDM   Amount and/or Complexity of Data Reviewed Clinical lab tests: ordered and reviewed Independent visualization of images, tracings, or specimens: yes     Final Clinical Impression(s) / ED Diagnoses Final diagnoses:  Exposure to COVID-19 virus    Rx / DC Orders ED Discharge Orders          Ordered    nirmatrelvir/ritonavir EUA (PAXLOVID) TABS  2 times daily        11/12/20 1009             Blanchie Dessert, MD 11/12/20 1009

## 2020-11-12 NOTE — ED Triage Notes (Signed)
Pt arrive spov with c/o sore throat and cough upon waking. Pt reports wife tested + for Covid today. Pt deneis fever

## 2020-12-19 DIAGNOSIS — Z20822 Contact with and (suspected) exposure to covid-19: Secondary | ICD-10-CM | POA: Diagnosis not present

## 2020-12-26 DIAGNOSIS — D1801 Hemangioma of skin and subcutaneous tissue: Secondary | ICD-10-CM | POA: Diagnosis not present

## 2020-12-26 DIAGNOSIS — D225 Melanocytic nevi of trunk: Secondary | ICD-10-CM | POA: Diagnosis not present

## 2020-12-26 DIAGNOSIS — L821 Other seborrheic keratosis: Secondary | ICD-10-CM | POA: Diagnosis not present

## 2020-12-26 DIAGNOSIS — Z85820 Personal history of malignant melanoma of skin: Secondary | ICD-10-CM | POA: Diagnosis not present

## 2020-12-26 DIAGNOSIS — L570 Actinic keratosis: Secondary | ICD-10-CM | POA: Diagnosis not present

## 2020-12-26 DIAGNOSIS — Z85828 Personal history of other malignant neoplasm of skin: Secondary | ICD-10-CM | POA: Diagnosis not present

## 2020-12-26 DIAGNOSIS — L400 Psoriasis vulgaris: Secondary | ICD-10-CM | POA: Diagnosis not present

## 2021-01-23 DIAGNOSIS — M5136 Other intervertebral disc degeneration, lumbar region: Secondary | ICD-10-CM | POA: Diagnosis not present

## 2021-01-23 DIAGNOSIS — Z6826 Body mass index (BMI) 26.0-26.9, adult: Secondary | ICD-10-CM | POA: Diagnosis not present

## 2021-01-23 DIAGNOSIS — I10 Essential (primary) hypertension: Secondary | ICD-10-CM | POA: Diagnosis not present

## 2021-01-23 DIAGNOSIS — S32009K Unspecified fracture of unspecified lumbar vertebra, subsequent encounter for fracture with nonunion: Secondary | ICD-10-CM | POA: Diagnosis not present

## 2021-03-06 DIAGNOSIS — Z23 Encounter for immunization: Secondary | ICD-10-CM | POA: Diagnosis not present

## 2021-04-11 DIAGNOSIS — H35342 Macular cyst, hole, or pseudohole, left eye: Secondary | ICD-10-CM | POA: Diagnosis not present

## 2021-05-09 DIAGNOSIS — H35342 Macular cyst, hole, or pseudohole, left eye: Secondary | ICD-10-CM | POA: Diagnosis not present

## 2021-05-09 DIAGNOSIS — H5310 Unspecified subjective visual disturbances: Secondary | ICD-10-CM | POA: Diagnosis not present

## 2021-06-01 DIAGNOSIS — H35342 Macular cyst, hole, or pseudohole, left eye: Secondary | ICD-10-CM | POA: Diagnosis not present

## 2021-06-05 DIAGNOSIS — M79672 Pain in left foot: Secondary | ICD-10-CM | POA: Diagnosis not present

## 2021-06-05 DIAGNOSIS — I10 Essential (primary) hypertension: Secondary | ICD-10-CM | POA: Diagnosis not present

## 2021-06-05 DIAGNOSIS — Z6827 Body mass index (BMI) 27.0-27.9, adult: Secondary | ICD-10-CM | POA: Diagnosis not present

## 2021-06-05 DIAGNOSIS — M79673 Pain in unspecified foot: Secondary | ICD-10-CM | POA: Insufficient documentation

## 2021-06-18 DIAGNOSIS — E041 Nontoxic single thyroid nodule: Secondary | ICD-10-CM | POA: Insufficient documentation

## 2021-06-19 ENCOUNTER — Ambulatory Visit (INDEPENDENT_AMBULATORY_CARE_PROVIDER_SITE_OTHER): Payer: Medicare Other | Admitting: Podiatry

## 2021-06-19 ENCOUNTER — Ambulatory Visit (INDEPENDENT_AMBULATORY_CARE_PROVIDER_SITE_OTHER): Payer: Medicare Other

## 2021-06-19 ENCOUNTER — Encounter: Payer: Self-pay | Admitting: Podiatry

## 2021-06-19 ENCOUNTER — Other Ambulatory Visit: Payer: Self-pay

## 2021-06-19 DIAGNOSIS — M778 Other enthesopathies, not elsewhere classified: Secondary | ICD-10-CM

## 2021-06-19 DIAGNOSIS — M5416 Radiculopathy, lumbar region: Secondary | ICD-10-CM | POA: Diagnosis not present

## 2021-06-19 NOTE — Progress Notes (Signed)
Subjective:  Patient ID: Brent Hanna, male    DOB: 03/19/1941,  MRN: [id number] HPI Chief Complaint  Patient presents with   Foot Pain    Plantar forefoot left - numbness x 1 year, back surgery 18 years ago, had had to get plate and screws removed, thought hardware was pinching a nerve, but foot is still the same if not worse than what it was, stopped activities-walking a lot, golfing, take advil as needed   New Patient (Initial Visit)    81 y.o. male presents with the above complaint.   ROS: Denies fever chills nausea run muscle aches pains calf pain back pain chest pain shortness of breath.  Past Medical History:  Diagnosis Date   Arthritis    BPH (benign prostatic hyperplasia)    Depression    GERD (gastroesophageal reflux disease)    occasionally    History of kidney stones    Hypertension    Mild back pain    Numbness and tingling    Pain    BACK PAIN - HAD BACK SURG OCT 2014 - STILL A LOT OF PAIN   Pain in left shin    Lateral radiating to the bottom of left foot.   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   Past Surgical History:  Procedure Laterality Date   ABDOMINAL EXPOSURE N/A 04/17/2020   Procedure: ABDOMINAL EXPOSURE;  Surgeon: Marty Heck, MD;  Location: [facility];  Service: Vascular;  Laterality: N/A;   ANTERIOR LUMBAR FUSION N/A 04/17/2020   Procedure: Anterior Lumbar Interbody Fusion  - Lumbar five-Sacral one;  Surgeon: Kary Kos, MD;  Location: [facility];  Service: Neurosurgery;  Laterality: N/A;   APPENDECTOMY     COLONOSCOPY W/ BIOPSIES AND POLYPECTOMY  2013   EXTRACORPOREAL SHOCK WAVE LITHOTRIPSY Left 09/11/2017   Procedure: LEFT EXTRACORPOREAL SHOCK WAVE LITHOTRIPSY (ESWL);  Surgeon: Ceasar Mons, MD;  [facility];  Service: Urology;  Laterality: Left;   LAMINECTOMY WITH POSTERIOR LATERAL ARTHRODESIS LEVEL 1 Left 03/24/2013   Procedure: LEFT LUMBAR FIVE-SACRAL ONE LUMBAR  LAMINECTOMY,FACETECTOMY,FORAMINOTOMY AND POSSIBLE MICRODISKECTOMY,POSTERIOR LATERAL ARTHRODESIS;  Surgeon: Hosie Spangle, MD;  Location: [facility];  Service: Neurosurgery;  Laterality: Left;  left   NECK SURGERY Left 1978   piece of metal removed, pt. reports he does have some metal in the neck   TONSILLECTOMY     TRANSURETHRAL RESECTION OF PROSTATE N/A 08/02/2013   Procedure: TRANSURETHRAL RESECTION OF THE PROSTATE WITH GYRUS INSTRUMENTS;  Surgeon: Ailene Rud, MD;  [facility];  Service: Urology;  Laterality: N/A;    Current Outpatient Medications:    ibuprofen (ADVIL) 200 MG tablet, Take 400 mg by mouth every 6 (six) hours as needed for headache or moderate pain., Disp: , Rfl:    losartan (COZAAR) 50 MG tablet, Take 50 mg by mouth daily. , Disp: , Rfl:    omeprazole (PRILOSEC OTC) 20 MG tablet, Take 20 mg by mouth every other day., Disp: , Rfl:   No Known Allergies Review of Systems Objective:  There were no vitals filed for this visit.  General: Well developed, nourished, in no acute distress, alert and oriented x3   Dermatological: Skin is warm, dry and supple bilateral. Nails x 10 are well maintained; remaining integument appears unremarkable at this time. There are no open sores, no preulcerative lesions, no rash or signs of infection present.  Vascular: Dorsalis Pedis artery and Posterior Tibial artery pedal pulses  are 2/4 bilateral with immedate capillary fill time. Pedal hair growth present. No varicosities and no lower extremity edema present bilateral.   Neruologic: Grossly intact via light touch bilateral. Vibratory intact via tuning fork bilateral. Protective threshold with Semmes Wienstein monofilament diminished to all pedal sites b left normal on the right side patellar and Achilles deep tendon reflexes 2+ bilateral. No Babinski or clonus noted bilateral.  Nerve conduction test and EMG demonstrates L4-L5 radiculopathy.  Previous back surgery addressed  L5-S1 according to the patient.  Negative tarsal tunnel symptoms  Musculoskeletal: No gross boney pedal deformities bilateral. No pain, crepitus, or limitation noted with foot and ankle range of motion bilateral. Muscular strength 4/5 in all groups tested left.  +5/5 right.  Gait: Unassisted, Nonantalgic.    Radiographs:  Radiographs taken today demonstrate an osseously mature individual left foot appears to be normal no acute findings.  Normal osseous architecture.  Mild tailor's bunion deformity.  Assessment & Plan:   Assessment: Most likely 4 5 radiculopathy based on testing  Plan: I recommended he follow-up with Dr. Davy Pique for evaluation and treatment     Reinaldo Helt T. [place], [place]

## 2021-06-20 NOTE — Addendum Note (Signed)
Addended by: Rip Harbour on: 06/20/2021 05:37 PM   Modules accepted: Orders

## 2021-07-18 DIAGNOSIS — I10 Essential (primary) hypertension: Secondary | ICD-10-CM | POA: Diagnosis not present

## 2021-07-18 DIAGNOSIS — M79672 Pain in left foot: Secondary | ICD-10-CM | POA: Diagnosis not present

## 2021-07-18 DIAGNOSIS — Z6827 Body mass index (BMI) 27.0-27.9, adult: Secondary | ICD-10-CM | POA: Diagnosis not present

## 2021-07-18 DIAGNOSIS — M5416 Radiculopathy, lumbar region: Secondary | ICD-10-CM | POA: Diagnosis not present

## 2021-08-13 DIAGNOSIS — M5416 Radiculopathy, lumbar region: Secondary | ICD-10-CM | POA: Diagnosis not present

## 2021-09-18 DIAGNOSIS — D225 Melanocytic nevi of trunk: Secondary | ICD-10-CM | POA: Diagnosis not present

## 2021-09-18 DIAGNOSIS — Z85820 Personal history of malignant melanoma of skin: Secondary | ICD-10-CM | POA: Diagnosis not present

## 2021-09-18 DIAGNOSIS — Z85828 Personal history of other malignant neoplasm of skin: Secondary | ICD-10-CM | POA: Diagnosis not present

## 2021-09-18 DIAGNOSIS — D2239 Melanocytic nevi of other parts of face: Secondary | ICD-10-CM | POA: Diagnosis not present

## 2021-09-18 DIAGNOSIS — L814 Other melanin hyperpigmentation: Secondary | ICD-10-CM | POA: Diagnosis not present

## 2021-09-18 DIAGNOSIS — L821 Other seborrheic keratosis: Secondary | ICD-10-CM | POA: Diagnosis not present

## 2021-09-18 DIAGNOSIS — L720 Epidermal cyst: Secondary | ICD-10-CM | POA: Diagnosis not present

## 2021-09-19 DIAGNOSIS — Z125 Encounter for screening for malignant neoplasm of prostate: Secondary | ICD-10-CM | POA: Diagnosis not present

## 2021-09-19 DIAGNOSIS — E041 Nontoxic single thyroid nodule: Secondary | ICD-10-CM | POA: Diagnosis not present

## 2021-09-19 DIAGNOSIS — I10 Essential (primary) hypertension: Secondary | ICD-10-CM | POA: Diagnosis not present

## 2021-09-25 DIAGNOSIS — M79672 Pain in left foot: Secondary | ICD-10-CM | POA: Diagnosis not present

## 2021-09-25 DIAGNOSIS — C439 Malignant melanoma of skin, unspecified: Secondary | ICD-10-CM | POA: Diagnosis not present

## 2021-09-25 DIAGNOSIS — R82998 Other abnormal findings in urine: Secondary | ICD-10-CM | POA: Diagnosis not present

## 2021-09-25 DIAGNOSIS — M47817 Spondylosis without myelopathy or radiculopathy, lumbosacral region: Secondary | ICD-10-CM | POA: Diagnosis not present

## 2021-09-25 DIAGNOSIS — I700 Atherosclerosis of aorta: Secondary | ICD-10-CM | POA: Diagnosis not present

## 2021-09-25 DIAGNOSIS — K219 Gastro-esophageal reflux disease without esophagitis: Secondary | ICD-10-CM | POA: Diagnosis not present

## 2021-09-25 DIAGNOSIS — Z1339 Encounter for screening examination for other mental health and behavioral disorders: Secondary | ICD-10-CM | POA: Diagnosis not present

## 2021-09-25 DIAGNOSIS — I10 Essential (primary) hypertension: Secondary | ICD-10-CM | POA: Diagnosis not present

## 2021-09-25 DIAGNOSIS — Z1331 Encounter for screening for depression: Secondary | ICD-10-CM | POA: Diagnosis not present

## 2021-09-25 DIAGNOSIS — F418 Other specified anxiety disorders: Secondary | ICD-10-CM | POA: Diagnosis not present

## 2021-09-25 DIAGNOSIS — Z0000 Encounter for general adult medical examination without abnormal findings: Secondary | ICD-10-CM | POA: Diagnosis not present

## 2021-09-25 DIAGNOSIS — M1990 Unspecified osteoarthritis, unspecified site: Secondary | ICD-10-CM | POA: Diagnosis not present

## 2021-09-25 DIAGNOSIS — N401 Enlarged prostate with lower urinary tract symptoms: Secondary | ICD-10-CM | POA: Diagnosis not present

## 2021-12-25 IMAGING — CR DG OR LOCAL ABDOMEN
1 series · 1 of 1 positions shown · non-contrast
Comparison: None.

CLINICAL DATA: Surgical anterior fusion of L5-S1.

EXAM:
OR LOCAL ABDOMEN

[PA]
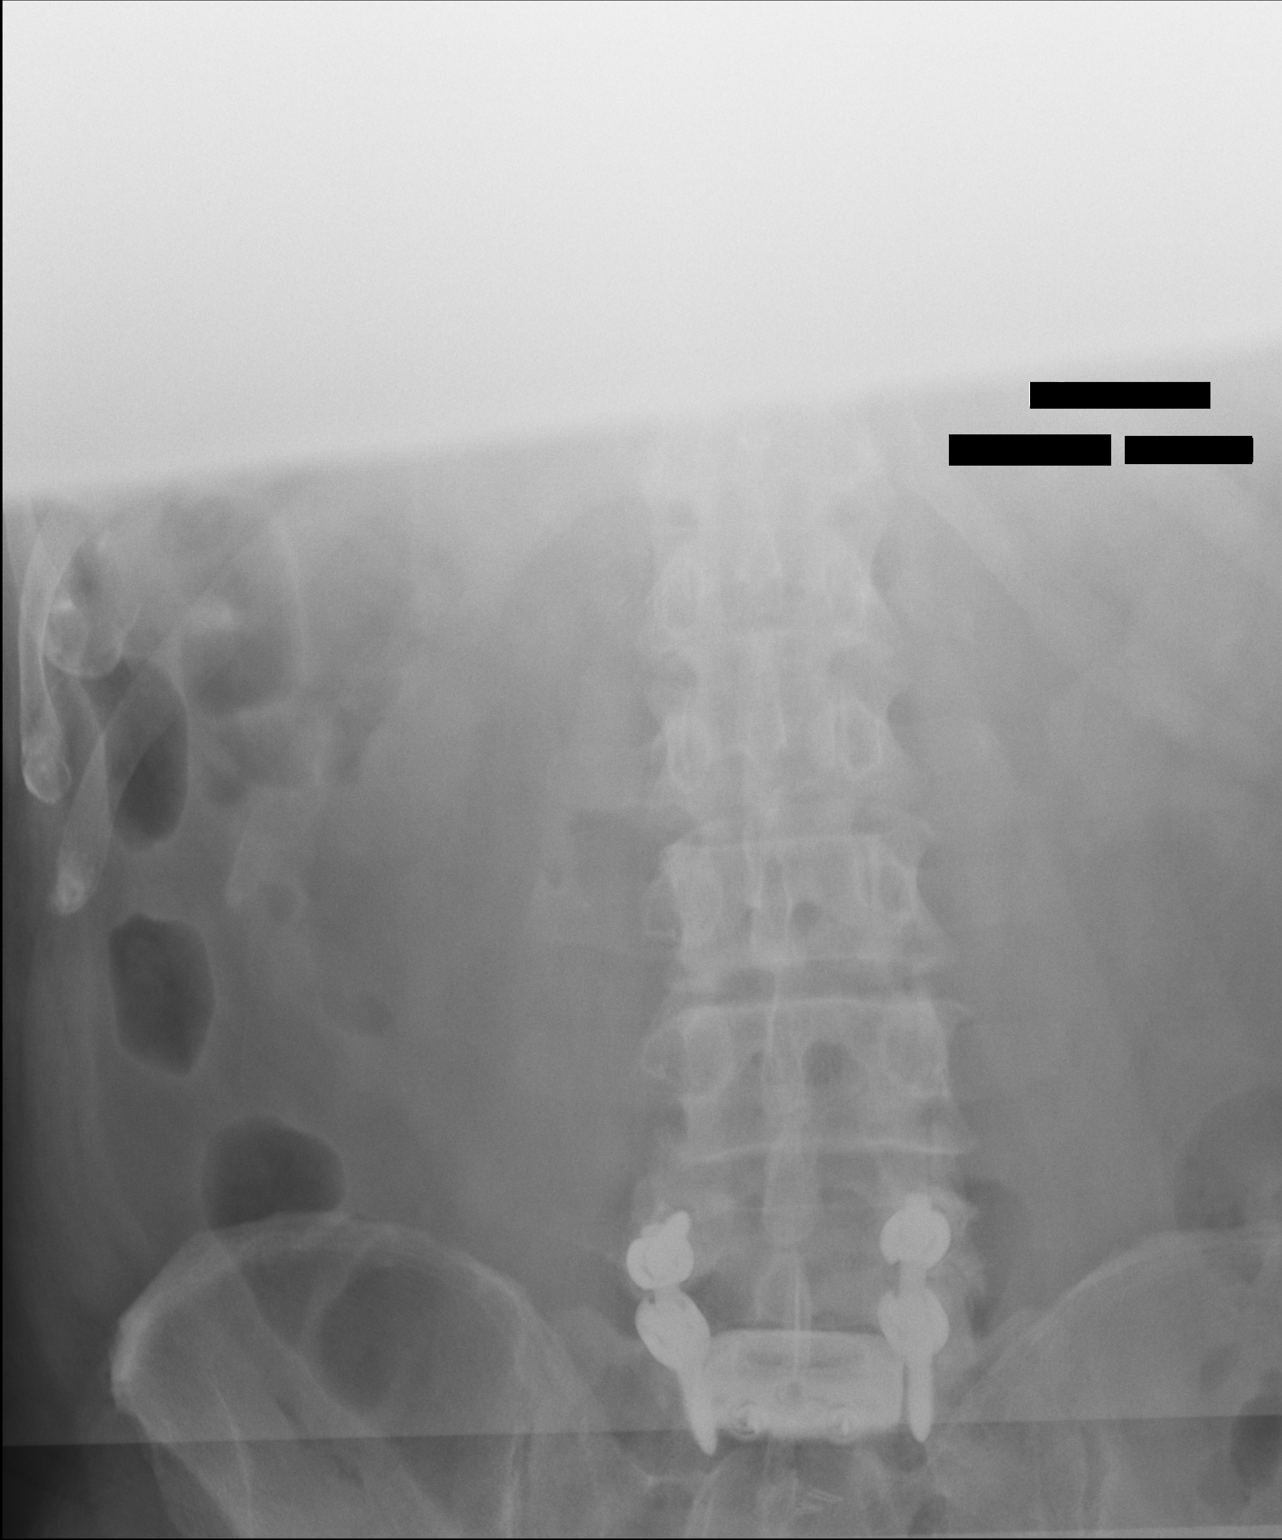

[1 of 1 positions shown; findings below may reference images not displayed]

FINDINGS: The bowel gas pattern is normal. No radio-opaque calculi or other
significant radiographic abnormality are seen. Status post surgical
anterior and posterior fusion of L5-S1. No other radiopaque foreign
body is noted.
IMPRESSION: Status post surgical fusion of L5-S1. No other radiopaque foreign
body is noted. These results were called by telephone at the time of
interpretation on 04/17/2020 at [DATE] to provider Dillman
Kairit, who verbally acknowledged these results.

## 2022-01-21 DIAGNOSIS — M25512 Pain in left shoulder: Secondary | ICD-10-CM | POA: Diagnosis not present

## 2022-02-25 DIAGNOSIS — M7502 Adhesive capsulitis of left shoulder: Secondary | ICD-10-CM | POA: Diagnosis not present

## 2022-03-18 DIAGNOSIS — Z23 Encounter for immunization: Secondary | ICD-10-CM | POA: Diagnosis not present

## 2022-06-17 DIAGNOSIS — H35372 Puckering of macula, left eye: Secondary | ICD-10-CM | POA: Diagnosis not present

## 2022-06-17 DIAGNOSIS — H26492 Other secondary cataract, left eye: Secondary | ICD-10-CM | POA: Diagnosis not present

## 2022-06-17 DIAGNOSIS — H52203 Unspecified astigmatism, bilateral: Secondary | ICD-10-CM | POA: Diagnosis not present

## 2022-07-10 DIAGNOSIS — H26492 Other secondary cataract, left eye: Secondary | ICD-10-CM | POA: Diagnosis not present

## 2022-09-19 DIAGNOSIS — L821 Other seborrheic keratosis: Secondary | ICD-10-CM | POA: Diagnosis not present

## 2022-09-19 DIAGNOSIS — Z85828 Personal history of other malignant neoplasm of skin: Secondary | ICD-10-CM | POA: Diagnosis not present

## 2022-09-19 DIAGNOSIS — D225 Melanocytic nevi of trunk: Secondary | ICD-10-CM | POA: Diagnosis not present

## 2022-09-19 DIAGNOSIS — L820 Inflamed seborrheic keratosis: Secondary | ICD-10-CM | POA: Diagnosis not present

## 2022-09-19 DIAGNOSIS — L570 Actinic keratosis: Secondary | ICD-10-CM | POA: Diagnosis not present

## 2022-09-19 DIAGNOSIS — Z85820 Personal history of malignant melanoma of skin: Secondary | ICD-10-CM | POA: Diagnosis not present

## 2022-10-04 DIAGNOSIS — E041 Nontoxic single thyroid nodule: Secondary | ICD-10-CM | POA: Diagnosis not present

## 2022-10-04 DIAGNOSIS — K219 Gastro-esophageal reflux disease without esophagitis: Secondary | ICD-10-CM | POA: Diagnosis not present

## 2022-10-04 DIAGNOSIS — I10 Essential (primary) hypertension: Secondary | ICD-10-CM | POA: Diagnosis not present

## 2022-10-04 DIAGNOSIS — Z125 Encounter for screening for malignant neoplasm of prostate: Secondary | ICD-10-CM | POA: Diagnosis not present

## 2022-10-04 DIAGNOSIS — N529 Male erectile dysfunction, unspecified: Secondary | ICD-10-CM | POA: Diagnosis not present

## 2022-10-10 DIAGNOSIS — R809 Proteinuria, unspecified: Secondary | ICD-10-CM | POA: Diagnosis not present

## 2022-10-10 DIAGNOSIS — D72819 Decreased white blood cell count, unspecified: Secondary | ICD-10-CM | POA: Diagnosis not present

## 2022-10-10 DIAGNOSIS — C439 Malignant melanoma of skin, unspecified: Secondary | ICD-10-CM | POA: Diagnosis not present

## 2022-10-10 DIAGNOSIS — R82998 Other abnormal findings in urine: Secondary | ICD-10-CM | POA: Diagnosis not present

## 2022-10-10 DIAGNOSIS — E785 Hyperlipidemia, unspecified: Secondary | ICD-10-CM | POA: Diagnosis not present

## 2022-10-10 DIAGNOSIS — M1990 Unspecified osteoarthritis, unspecified site: Secondary | ICD-10-CM | POA: Diagnosis not present

## 2022-10-10 DIAGNOSIS — N401 Enlarged prostate with lower urinary tract symptoms: Secondary | ICD-10-CM | POA: Diagnosis not present

## 2022-10-10 DIAGNOSIS — Z0000 Encounter for general adult medical examination without abnormal findings: Secondary | ICD-10-CM | POA: Diagnosis not present

## 2022-10-10 DIAGNOSIS — Z1212 Encounter for screening for malignant neoplasm of rectum: Secondary | ICD-10-CM | POA: Diagnosis not present

## 2022-10-10 DIAGNOSIS — I700 Atherosclerosis of aorta: Secondary | ICD-10-CM | POA: Diagnosis not present

## 2022-10-10 DIAGNOSIS — M79672 Pain in left foot: Secondary | ICD-10-CM | POA: Diagnosis not present

## 2022-10-10 DIAGNOSIS — I10 Essential (primary) hypertension: Secondary | ICD-10-CM | POA: Diagnosis not present

## 2022-11-01 DIAGNOSIS — R1030 Lower abdominal pain, unspecified: Secondary | ICD-10-CM | POA: Diagnosis not present

## 2022-11-01 DIAGNOSIS — I10 Essential (primary) hypertension: Secondary | ICD-10-CM | POA: Diagnosis not present

## 2022-11-01 DIAGNOSIS — R531 Weakness: Secondary | ICD-10-CM | POA: Diagnosis not present

## 2022-11-01 DIAGNOSIS — R5383 Other fatigue: Secondary | ICD-10-CM | POA: Diagnosis not present

## 2022-11-01 DIAGNOSIS — R152 Fecal urgency: Secondary | ICD-10-CM | POA: Diagnosis not present

## 2022-11-04 ENCOUNTER — Other Ambulatory Visit: Payer: Self-pay | Admitting: Internal Medicine

## 2022-11-04 DIAGNOSIS — R1030 Lower abdominal pain, unspecified: Secondary | ICD-10-CM

## 2022-11-07 ENCOUNTER — Ambulatory Visit
Admission: RE | Admit: 2022-11-07 | Discharge: 2022-11-07 | Disposition: A | Discharge status: Home / Self Care | Payer: Medicare Other | Source: Ambulatory Visit | Attending: Internal Medicine | Admitting: Internal Medicine

## 2022-11-07 DIAGNOSIS — R1030 Lower abdominal pain, unspecified: Secondary | ICD-10-CM | POA: Diagnosis not present

## 2022-11-07 MED ORDER — IOPAMIDOL (ISOVUE-300) INJECTION 61%
100.0000 mL | Freq: Once | INTRAVENOUS | Status: AC | PRN
  Administered 2022-11-07: 100 mL via INTRAVENOUS

## 2023-01-07 DIAGNOSIS — Z23 Encounter for immunization: Secondary | ICD-10-CM | POA: Diagnosis not present

## 2023-01-28 DIAGNOSIS — Z23 Encounter for immunization: Secondary | ICD-10-CM | POA: Diagnosis not present

## 2023-07-21 DIAGNOSIS — I10 Essential (primary) hypertension: Secondary | ICD-10-CM | POA: Diagnosis not present

## 2023-07-21 DIAGNOSIS — J40 Bronchitis, not specified as acute or chronic: Secondary | ICD-10-CM | POA: Diagnosis not present

## 2023-07-21 DIAGNOSIS — R0602 Shortness of breath: Secondary | ICD-10-CM | POA: Diagnosis not present

## 2023-07-21 DIAGNOSIS — J101 Influenza due to other identified influenza virus with other respiratory manifestations: Secondary | ICD-10-CM | POA: Diagnosis not present

## 2023-07-21 DIAGNOSIS — R051 Acute cough: Secondary | ICD-10-CM | POA: Diagnosis not present

## 2023-07-23 DIAGNOSIS — H52203 Unspecified astigmatism, bilateral: Secondary | ICD-10-CM | POA: Diagnosis not present

## 2023-07-23 DIAGNOSIS — Z961 Presence of intraocular lens: Secondary | ICD-10-CM | POA: Diagnosis not present

## 2023-07-23 DIAGNOSIS — H43813 Vitreous degeneration, bilateral: Secondary | ICD-10-CM | POA: Diagnosis not present

## 2023-08-26 DIAGNOSIS — Z23 Encounter for immunization: Secondary | ICD-10-CM | POA: Diagnosis not present

## 2023-09-22 DIAGNOSIS — D2261 Melanocytic nevi of right upper limb, including shoulder: Secondary | ICD-10-CM | POA: Diagnosis not present

## 2023-09-22 DIAGNOSIS — D2271 Melanocytic nevi of right lower limb, including hip: Secondary | ICD-10-CM | POA: Diagnosis not present

## 2023-09-22 DIAGNOSIS — D2239 Melanocytic nevi of other parts of face: Secondary | ICD-10-CM | POA: Diagnosis not present

## 2023-09-22 DIAGNOSIS — L905 Scar conditions and fibrosis of skin: Secondary | ICD-10-CM | POA: Diagnosis not present

## 2023-09-22 DIAGNOSIS — Z85828 Personal history of other malignant neoplasm of skin: Secondary | ICD-10-CM | POA: Diagnosis not present

## 2023-09-22 DIAGNOSIS — L814 Other melanin hyperpigmentation: Secondary | ICD-10-CM | POA: Diagnosis not present

## 2023-09-22 DIAGNOSIS — D225 Melanocytic nevi of trunk: Secondary | ICD-10-CM | POA: Diagnosis not present

## 2023-09-22 DIAGNOSIS — L821 Other seborrheic keratosis: Secondary | ICD-10-CM | POA: Diagnosis not present

## 2023-09-22 DIAGNOSIS — D2262 Melanocytic nevi of left upper limb, including shoulder: Secondary | ICD-10-CM | POA: Diagnosis not present

## 2023-09-22 DIAGNOSIS — D2272 Melanocytic nevi of left lower limb, including hip: Secondary | ICD-10-CM | POA: Diagnosis not present

## 2023-09-22 DIAGNOSIS — Z85820 Personal history of malignant melanoma of skin: Secondary | ICD-10-CM | POA: Diagnosis not present

## 2023-10-09 DIAGNOSIS — E041 Nontoxic single thyroid nodule: Secondary | ICD-10-CM | POA: Diagnosis not present

## 2023-10-09 DIAGNOSIS — I10 Essential (primary) hypertension: Secondary | ICD-10-CM | POA: Diagnosis not present

## 2023-10-09 DIAGNOSIS — K219 Gastro-esophageal reflux disease without esophagitis: Secondary | ICD-10-CM | POA: Diagnosis not present

## 2023-10-09 DIAGNOSIS — E785 Hyperlipidemia, unspecified: Secondary | ICD-10-CM | POA: Diagnosis not present

## 2023-10-09 DIAGNOSIS — N401 Enlarged prostate with lower urinary tract symptoms: Secondary | ICD-10-CM | POA: Diagnosis not present

## 2023-10-16 DIAGNOSIS — C4491 Basal cell carcinoma of skin, unspecified: Secondary | ICD-10-CM | POA: Diagnosis not present

## 2023-10-16 DIAGNOSIS — Z1331 Encounter for screening for depression: Secondary | ICD-10-CM | POA: Diagnosis not present

## 2023-10-16 DIAGNOSIS — I10 Essential (primary) hypertension: Secondary | ICD-10-CM | POA: Diagnosis not present

## 2023-10-16 DIAGNOSIS — Z23 Encounter for immunization: Secondary | ICD-10-CM | POA: Diagnosis not present

## 2023-10-16 DIAGNOSIS — R3121 Asymptomatic microscopic hematuria: Secondary | ICD-10-CM | POA: Diagnosis not present

## 2023-10-16 DIAGNOSIS — M5416 Radiculopathy, lumbar region: Secondary | ICD-10-CM | POA: Diagnosis not present

## 2023-10-16 DIAGNOSIS — M1990 Unspecified osteoarthritis, unspecified site: Secondary | ICD-10-CM | POA: Diagnosis not present

## 2023-10-16 DIAGNOSIS — I493 Ventricular premature depolarization: Secondary | ICD-10-CM | POA: Diagnosis not present

## 2023-10-16 DIAGNOSIS — M47817 Spondylosis without myelopathy or radiculopathy, lumbosacral region: Secondary | ICD-10-CM | POA: Diagnosis not present

## 2023-10-16 DIAGNOSIS — M79672 Pain in left foot: Secondary | ICD-10-CM | POA: Diagnosis not present

## 2023-10-16 DIAGNOSIS — Z0000 Encounter for general adult medical examination without abnormal findings: Secondary | ICD-10-CM | POA: Diagnosis not present

## 2023-10-16 DIAGNOSIS — E785 Hyperlipidemia, unspecified: Secondary | ICD-10-CM | POA: Diagnosis not present

## 2023-10-16 DIAGNOSIS — Z1389 Encounter for screening for other disorder: Secondary | ICD-10-CM | POA: Diagnosis not present

## 2023-10-16 DIAGNOSIS — R809 Proteinuria, unspecified: Secondary | ICD-10-CM | POA: Diagnosis not present

## 2023-10-16 DIAGNOSIS — N401 Enlarged prostate with lower urinary tract symptoms: Secondary | ICD-10-CM | POA: Diagnosis not present

## 2024-04-02 DIAGNOSIS — Z23 Encounter for immunization: Secondary | ICD-10-CM | POA: Diagnosis not present
